# Patient Record
Sex: Male | Born: 1965 | Race: White | Hispanic: No | Marital: Married | Smoking: Current every day smoker
Health system: Southern US, Community
[De-identification: ages and names within clinical notes are randomized; demographics above are authoritative.]

## PROBLEM LIST (undated history)

## (undated) DIAGNOSIS — Z789 Other specified health status: Secondary | ICD-10-CM

## (undated) DIAGNOSIS — K4090 Unilateral inguinal hernia, without obstruction or gangrene, not specified as recurrent: Secondary | ICD-10-CM

## (undated) DIAGNOSIS — I2699 Other pulmonary embolism without acute cor pulmonale: Secondary | ICD-10-CM

## (undated) DIAGNOSIS — I82409 Acute embolism and thrombosis of unspecified deep veins of unspecified lower extremity: Secondary | ICD-10-CM

## (undated) HISTORY — PX: WISDOM TOOTH EXTRACTION: SHX21

---
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
## 2009-07-26 DIAGNOSIS — I2699 Other pulmonary embolism without acute cor pulmonale: Secondary | ICD-10-CM

## 2009-07-26 DIAGNOSIS — I82409 Acute embolism and thrombosis of unspecified deep veins of unspecified lower extremity: Secondary | ICD-10-CM

## 2009-07-26 HISTORY — PX: VENA CAVA FILTER PLACEMENT: SHX1085

## 2009-07-26 HISTORY — DX: Acute embolism and thrombosis of unspecified deep veins of unspecified lower extremity: I82.409

## 2009-07-26 HISTORY — PX: LEG SURGERY: SHX1003

## 2009-07-26 HISTORY — DX: Other pulmonary embolism without acute cor pulmonale: I26.99

## 2011-04-23 ENCOUNTER — Emergency Department (HOSPITAL_BASED_OUTPATIENT_CLINIC_OR_DEPARTMENT_OTHER)
Admission: EM | Admit: 2011-04-23 | Discharge: 2011-04-23 | Disposition: A | Discharge status: Home / Self Care | Payer: PRIVATE HEALTH INSURANCE | Attending: Emergency Medicine | Admitting: Emergency Medicine

## 2011-04-23 ENCOUNTER — Encounter: Payer: Self-pay | Admitting: Family Medicine

## 2011-04-23 DIAGNOSIS — M79609 Pain in unspecified limb: Secondary | ICD-10-CM | POA: Insufficient documentation

## 2011-04-23 DIAGNOSIS — F17200 Nicotine dependence, unspecified, uncomplicated: Secondary | ICD-10-CM | POA: Insufficient documentation

## 2011-04-23 DIAGNOSIS — L0390 Cellulitis, unspecified: Secondary | ICD-10-CM

## 2011-04-23 DIAGNOSIS — Z79899 Other long term (current) drug therapy: Secondary | ICD-10-CM | POA: Insufficient documentation

## 2011-04-23 DIAGNOSIS — Z86718 Personal history of other venous thrombosis and embolism: Secondary | ICD-10-CM | POA: Insufficient documentation

## 2011-04-23 HISTORY — DX: Acute embolism and thrombosis of unspecified deep veins of unspecified lower extremity: I82.409

## 2011-04-23 HISTORY — DX: Other pulmonary embolism without acute cor pulmonale: I26.99

## 2011-04-23 MED ORDER — SULFAMETHOXAZOLE-TRIMETHOPRIM 400-80 MG PO TABS
1.0000 | ORAL_TABLET | Freq: Once | ORAL | Status: DC
  Filled 2011-04-23: qty 1

## 2011-04-23 MED ORDER — SULFAMETHOXAZOLE-TMP DS 800-160 MG PO TABS
ORAL_TABLET | ORAL | Status: AC
  Administered 2011-04-23: 1 via ORAL
  Filled 2011-04-23: qty 1

## 2011-04-23 MED ORDER — SULFAMETHOXAZOLE-TMP DS 800-160 MG PO TABS
1.0000 | ORAL_TABLET | Freq: Once | ORAL | Status: AC
  Administered 2011-04-23: 1 via ORAL

## 2011-04-23 MED ORDER — CEPHALEXIN 500 MG PO CAPS: 500.0000 mg | ORAL_CAPSULE | Freq: Four times a day (QID) | ORAL | Status: AC

## 2011-04-23 MED ORDER — CEPHALEXIN 250 MG PO CAPS
500.0000 mg | ORAL_CAPSULE | Freq: Once | ORAL | Status: AC
  Administered 2011-04-23: 500 mg via ORAL
  Filled 2011-04-23: qty 2

## 2011-04-23 MED ORDER — SULFAMETHOXAZOLE-TRIMETHOPRIM 800-160 MG PO TABS: 1.0000 | ORAL_TABLET | Freq: Two times a day (BID) | ORAL | Status: AC

## 2011-04-23 NOTE — ED Provider Notes (Addendum)
History     CSN: [id number] Arrival date & time: 04/23/2011  3:59 PM  Chief Complaint  Patient presents with  . Arm Pain    (Consider location/radiation/quality/duration/timing/severity/associated sxs/prior treatment) HPI Comments: Pt has had a couple of small scratches to left forearm over last week or so.  Over last 2-3 days has had a red, tender place develop to left forearm, redness seems to be spreading, tenderness is worsening.  No animal bites to area  Patient is a 45 y.o. male presenting with arm pain. The history is provided by the patient.  Arm Pain This is a new problem. The current episode started more than 2 days ago. The problem occurs constantly. The problem has been gradually worsening. Pertinent negatives include no chest pain, no headaches and no shortness of breath. Associated symptoms comments: No fevers, boils, drainage. The symptoms are aggravated by nothing. The symptoms are relieved by nothing. He has tried nothing for the symptoms.    Past Medical History  Diagnosis Date  . Pulmonary embolism   . DVT (deep venous thrombosis)     Past Surgical History  Procedure Date  . Leg surgery   . Vena cava filter placement     No family history on file.  History  Substance Use Topics  . Smoking status: Current Everyday Smoker  . Smokeless tobacco: Not on file  . Alcohol Use: No      Review of Systems  Constitutional: Negative for fever, chills and fatigue.  Respiratory: Negative for chest tightness and shortness of breath.   Cardiovascular: Negative for chest pain.  Musculoskeletal: Negative for joint swelling.  Neurological: Negative for weakness and headaches.  Hematological: Negative for adenopathy.    Allergies  Review of patient's allergies indicates no known allergies.  Home Medications   Current Outpatient Rx  Name Route Sig Dispense Refill  . OXYCODONE-ACETAMINOPHEN 5-325 MG PO TABS Oral Take 1 tablet by mouth 2 (two) times daily.      .  CEPHALEXIN 500 MG PO CAPS Oral Take 1 capsule (500 mg total) by mouth 4 (four) times daily. 20 capsule 0  . SULFAMETHOXAZOLE-TRIMETHOPRIM 800-160 MG PO TABS Oral Take 1 tablet by mouth every 12 (twelve) hours. 20 tablet 0    BP 126/75  Pulse 84  Temp(Src) 97.9 F (36.6 C) (Oral)  Resp 16  Ht 6\' 2"  (1.88 m)  Wt 165 lb (74.844 kg)  BMI 21.18 kg/m2  SpO2 99%  Physical Exam  Constitutional: He is oriented to person, place, and time. He appears well-developed and well-nourished.  HENT:  Head: Normocephalic and atraumatic.  Neck: Neck supple.  Musculoskeletal: He exhibits tenderness.       Redness, warmth and tenderness to dorsal aspect of left mid forearm, extending about 4-5 cm.  No wounds noted other than some small scabbed scratches, no indurated or fluctuant areas.  No extension past elbow or wrist.  No pain with ROM of wrist suggesting tenosynovitis.  Neurological: He is alert and oriented to person, place, and time.  Skin: No rash noted.  Psychiatric: He has a normal mood and affect.    ED Course  Procedures (including critical care time)  Labs Reviewed - No data to display No results found.   1. Cellulitis       MDM  Pt appears to have localized cellulitis, no evidence of abscess or extension into deeper tissues.  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  Advised to return in 2 days for  a wound check or sooner for any worsening symptoms.  TDAP UTD        Rolan Bucco, MD 04/23/11 1624  Rolan Bucco, MD 04/23/11 [phone]

## 2011-04-23 NOTE — ED Notes (Signed)
Pt c/o left forearm "knotting up" x 2 days. Pt sts he was taken off coumadin 2 wks ago and had filter placed. Pt sts "my arm feels like my leg did when I had clots in it".

## 2012-06-01 ENCOUNTER — Encounter (HOSPITAL_BASED_OUTPATIENT_CLINIC_OR_DEPARTMENT_OTHER): Payer: Self-pay | Admitting: Emergency Medicine

## 2012-06-01 ENCOUNTER — Emergency Department (HOSPITAL_BASED_OUTPATIENT_CLINIC_OR_DEPARTMENT_OTHER)
Admission: EM | Admit: 2012-06-01 | Discharge: 2012-06-01 | Disposition: A | Discharge status: Home / Self Care | Payer: BC Managed Care – PPO | Attending: Emergency Medicine | Admitting: Emergency Medicine

## 2012-06-01 ENCOUNTER — Emergency Department (HOSPITAL_BASED_OUTPATIENT_CLINIC_OR_DEPARTMENT_OTHER): Payer: BC Managed Care – PPO

## 2012-06-01 DIAGNOSIS — Z86718 Personal history of other venous thrombosis and embolism: Secondary | ICD-10-CM | POA: Insufficient documentation

## 2012-06-01 DIAGNOSIS — F17200 Nicotine dependence, unspecified, uncomplicated: Secondary | ICD-10-CM | POA: Insufficient documentation

## 2012-06-01 DIAGNOSIS — K458 Other specified abdominal hernia without obstruction or gangrene: Secondary | ICD-10-CM | POA: Insufficient documentation

## 2012-06-01 DIAGNOSIS — R109 Unspecified abdominal pain: Secondary | ICD-10-CM | POA: Insufficient documentation

## 2012-06-01 DIAGNOSIS — K4090 Unilateral inguinal hernia, without obstruction or gangrene, not specified as recurrent: Secondary | ICD-10-CM

## 2012-06-01 NOTE — ED Provider Notes (Signed)
History     CSN: [id number]  Arrival date & time 06/01/12  1508   None     Chief Complaint  Patient presents with  . Groin Pain    (Consider location/radiation/quality/duration/timing/severity/associated sxs/prior treatment) Patient is a 46 y.o. male presenting with abdominal pain. The history is provided by the patient. No language interpreter was used.  Abdominal Pain The primary symptoms of the illness include abdominal pain. Episode onset: over a month. The onset of the illness was gradual. The problem has been gradually worsening.  Associated with: worse with standing. The patient has not had a change in bowel habit. Risk factors: none. Symptoms associated with the illness do not include anorexia or constipation. Significant associated medical issues do not include PUD, GERD or inflammatory bowel disease.  Pt has a large hernia left side of lower abdomen.   Pt reports hernia sticks out when he stands up.   Area goes down when he lies down.   Pt reports he has a ECLIPSE filter in vena cava.  Pt is worried that filter has slipped down and is causing the pain  Past Medical History  Diagnosis Date  . Pulmonary embolism   . DVT (deep venous thrombosis)     Past Surgical History  Procedure Date  . Leg surgery   . Vena cava filter placement     No family history on file.  History  Substance Use Topics  . Smoking status: Current Every Day Smoker  . Smokeless tobacco: Not on file  . Alcohol Use: No      Review of Systems  Gastrointestinal: Positive for abdominal pain. Negative for constipation and anorexia.  Genitourinary: Negative for scrotal swelling and testicular pain.  All other systems reviewed and are negative.    Allergies  Review of patient's allergies indicates no known allergies.  Home Medications  No current outpatient prescriptions on file.  BP 135/82  Pulse 83  Temp 98.2 F (36.8 C) (Oral)  Resp 18  Ht 6\' 2"  (1.88 m)  Wt 162 lb (73.483 kg)   BMI 20.80 kg/m2  SpO2 100%  Physical Exam  Nursing note and vitals reviewed. Constitutional: He is oriented to person, place, and time. He appears well-developed and well-nourished.  HENT:  Head: Normocephalic.  Right Ear: External ear normal.  Left Ear: External ear normal.  Nose: Nose normal.  Mouth/Throat: Oropharynx is clear and moist.  Eyes: Conjunctivae normal and EOM are normal. Pupils are equal, round, and reactive to light.  Neck: Normal range of motion. Neck supple.  Cardiovascular: Normal rate, regular rhythm and normal heart sounds.   Pulmonary/Chest: Effort normal and breath sounds normal.  Abdominal: Soft. Bowel sounds are normal.  Musculoskeletal: Normal range of motion.  Neurological: He is alert and oriented to person, place, and time. He has normal reflexes.  Skin: Skin is warm.  Psychiatric: He has a normal mood and affect.    ED Course  Procedures (including critical care time)  Labs Reviewed - No data to display No results found.   1. Left groin hernia       MDM  Xray shows filter is in vena cava in good position.   I advised pt I suspect hernia is the cause of his discomfort.   I advised pt to see general surgery for evaluation.  I counseled on symptoms or hernia incarceration        Elson Areas, [place] 06/01/12 1735  Lonia Skinner [place], [place] 06/01/12 1737

## 2012-06-01 NOTE — ED Notes (Signed)
Sudden sharp rt groin pain.  Recent "ECLIPSE" filter placed

## 2012-06-02 NOTE — ED Provider Notes (Signed)
Medical screening examination/treatment/procedure(s) were performed by non-physician practitioner and as supervising physician I was immediately available for consultation/collaboration.   Carleene Cooper III, MD 06/02/12 [phone]

## 2012-06-28 ENCOUNTER — Encounter (INDEPENDENT_AMBULATORY_CARE_PROVIDER_SITE_OTHER): Payer: Self-pay | Admitting: General Surgery

## 2012-06-28 ENCOUNTER — Ambulatory Visit (INDEPENDENT_AMBULATORY_CARE_PROVIDER_SITE_OTHER): Payer: BC Managed Care – PPO | Admitting: General Surgery

## 2012-06-28 VITALS — BP 122/84 | HR 83 | Temp 98.3°F | Ht 74.0 in | Wt 172.0 lb

## 2012-06-28 DIAGNOSIS — K4090 Unilateral inguinal hernia, without obstruction or gangrene, not specified as recurrent: Secondary | ICD-10-CM

## 2012-06-28 DIAGNOSIS — K439 Ventral hernia without obstruction or gangrene: Secondary | ICD-10-CM

## 2012-06-28 NOTE — Progress Notes (Signed)
Patient ID: Tyler Waters, male   DOB: [birth year], 46 y.o.   MRN: [id number]  Chief Complaint  Patient presents with  . Pre-op Exam    eval LIH and umb hernias    HPI OVA Tyler Waters is a 46 y.o. male.  This patient is referred by Dr. Ignacia Palma for evaluation of a symptomatic left inguinal hernia. He first noticed this about 6 years ago after lifting an engine for a vehicle. He noticed a bulge at his umbilicus as well as his left groin. He says that this has not really bothered him but has become larger over the last 6 years with a more rapid increase over the last year. He says it reduces when lying flat and does have some pain when the hernia bulges but otherwise denies any obstructive symptoms. He says that his bowels are functioning normally. He does have a history of DVT and pulmonary embolus in 2011 after broken leg but he says that he is not on any anticoagulation for this. HPI  Past Medical History  Diagnosis Date  . Pulmonary embolism   . DVT (deep venous thrombosis)     Past Surgical History  Procedure Date  . Leg surgery   . Vena cava filter placement     History reviewed. No pertinent family history.  Social History History  Substance Use Topics  . Smoking status: Current Every Day Smoker -- 0.5 packs/day    Types: Cigarettes  . Smokeless tobacco: Not on file  . Alcohol Use: No    No Known Allergies  No current outpatient prescriptions on file.    Review of Systems Review of Systems All other review of systems negative or noncontributory except as stated in the HPI  Blood pressure 122/84, pulse 83, temperature 98.3 F (36.8 C), temperature source Temporal, height 6\' 2"  (1.88 m), weight 172 lb (78.019 kg), SpO2 98.00%.  Physical Exam Physical Exam Physical Exam  Vitals reviewed. Constitutional: He is oriented to person, place, and time. He appears well-developed and well-nourished. No distress.  HENT:  Head: Normocephalic and atraumatic.  Mouth/Throat: No  oropharyngeal exudate.  Eyes: Conjunctivae and EOM are normal. Pupils are equal, round, and reactive to light. Right eye exhibits no discharge. Left eye exhibits no discharge. No scleral icterus.  Neck: Normal range of motion. No tracheal deviation present.  Cardiovascular: Normal rate, regular rhythm and normal heart sounds.   Pulmonary/Chest: Effort normal and breath sounds normal. No stridor. No respiratory distress. He has no wheezes. He has no rales. He exhibits no tenderness.  Abdominal: Soft. Bowel sounds are normal. He exhibits no distension and no mass. There is no tenderness. There is no rebound and no guarding. he does have a moderate sized reducible left inguinal hernia no right inguinal hernia. He also has a small supraumbilical bulge just above his umbilicus which does not appear reducible. This is nontender Musculoskeletal: Normal range of motion. He exhibits no edema and no tenderness.  Neurological: He is alert and oriented to person, place, and time.  Skin: Skin is warm and dry. No rash noted. He is not diaphoretic. No erythema. No pallor.  Psychiatric: He has a normal mood and affect. His behavior is normal. Judgment and thought content normal.    Data Reviewed   Assessment    Symptomatic left inguinal hernia-reducible Small supraumbilical hernia I discussed with him the options for laparoscopic or open repair of his hernia. We discussed the options of observation versus surgery and he is interested in surgical  repair. I think that because this is a fairly large inguinal hernia as well as the need for anticoagulation perioperatively I think that open repair would be best for this patient. Given his DVT and PE, I would plan on preoperative Lovenox. We discussed the pros and cons of repair versus observation and he would like to proceed with repair. We discussed the risks of infection, bleeding, pain, scarring, nerve injury, chronic pain, injury to the vas deferens or testicle,  bowel injury and he expressed understanding and would like to proceed with repair of both defects.    Plan    We will plan for open left inguinal hernia repair with mesh as well as supraumbilical hernia repair with possible mesh. Lovenox preoperatively       Misaki Sozio DAVID 06/28/2012, 9:45 AM

## 2012-09-12 ENCOUNTER — Telehealth (INDEPENDENT_AMBULATORY_CARE_PROVIDER_SITE_OTHER): Payer: Self-pay | Admitting: *Deleted

## 2012-09-12 NOTE — Telephone Encounter (Signed)
Patient called to state that he is having increased pain at the umbilical hernia.  Patient states he was suppose to schedule surgery in December 2013 but never did.  Patient is requesting to see Biagio Quint MD at this time so that surgery can be scheduled.

## 2012-09-15 ENCOUNTER — Ambulatory Visit (INDEPENDENT_AMBULATORY_CARE_PROVIDER_SITE_OTHER): Payer: BC Managed Care – PPO | Admitting: General Surgery

## 2012-09-15 ENCOUNTER — Telehealth (INDEPENDENT_AMBULATORY_CARE_PROVIDER_SITE_OTHER): Payer: Self-pay | Admitting: General Surgery

## 2012-09-15 VITALS — BP 108/68 | HR 78 | Temp 97.4°F | Resp 18 | Ht 74.0 in | Wt 172.0 lb

## 2012-09-15 NOTE — Progress Notes (Signed)
Patient ID: Tyler Waters, male   DOB: 07/20/1966, 46 y.o.   MRN: [id number]  Chief Complaint  Patient presents with  . New Evaluation    evakl umbilical hernia    HPI Tyler Waters is a 46 y.o. male.  This patient is known to me for prior evaluation of a symptomatic left  Inguinal hernia in the supraumbilical hernia. I saw him back in December for the same complaints. He has had a long-standing history of a symptomatic left inguinal hernia and he says that this has been increasing in size although the discomfort in the left groin has actually improved. He says that the umbilical area has developed more discomfort over the last little while and this is actually bothering him more than the groin. He says that the bulges are still reducible but he would like to have these repaired now. 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 He does have an IVC filter in place. HPI  Past Medical History  Diagnosis Date  . Pulmonary embolism   . DVT (deep venous thrombosis)     Past Surgical History  Procedure Laterality Date  . Leg surgery    . Vena cava filter placement      No family history on file.  Social History History  Substance Use Topics  . Smoking status: Current Every Day Smoker -- 0.50 packs/day    Types: Cigarettes  . Smokeless tobacco: Not on file  . Alcohol Use: No    No Known Allergies  No current outpatient prescriptions on file.   No current facility-administered medications for this visit.    Review of Systems Review of Systems All other review of systems negative or noncontributory except as stated in the HPI  Blood pressure 108/68, pulse 78, temperature 97.4 F (36.3 C), resp. rate 18, height 6' 2" (1.88 m), weight 172 lb (78.019 kg).  Physical Exam Physical Exam Physical Exam  Vitals reviewed. Constitutional: He is oriented to person, place, and time. He appears well-developed and well-nourished. No distress.  HENT:   Head: Normocephalic and atraumatic.  Mouth/Throat: No oropharyngeal exudate.  Eyes: Conjunctivae and EOM are normal. Pupils are equal, round, and reactive to light. Right eye exhibits no discharge. Left eye exhibits no discharge. No scleral icterus.  Neck: Normal range of motion. No tracheal deviation present.  Cardiovascular: Normal rate, regular rhythm and normal heart sounds.   Pulmonary/Chest: Effort normal and breath sounds normal. No stridor. No respiratory distress. He has no wheezes. He has no rales. He exhibits no tenderness.  Abdominal: Soft. Bowel sounds are normal. He exhibits no distension and no mass. There is no tenderness. There is no rebound and no guarding. he does have a reducible fairly decent sized left inguinal hernia. No right inguinal hernia. He does have a small reducible supraumbilical bulge which is tender to palpation today. Musculoskeletal: Normal range of motion. He exhibits no edema and no tenderness.  Neurological: He is alert and oriented to person, place, and time.  Skin: Skin is warm and dry. No rash noted. He is not diaphoretic. No erythema. No pallor.  Psychiatric: He has a normal mood and affect. His behavior is normal. Judgment and thought content normal.    Data Reviewed   Assessment    Symptomatic an enlarging left inguinal hernia Symptomatic supraumbilical hernia He does have reducible supraumbilical and left inguinal hernia on exam and these have been increasing in size and becoming more symptomatic.   He would like to go ahead and have these repaired. Because of the need for anticoagulation perioperatively given his history of DVT and PE I. Would not really recommend laparoscopic repair for these. I will plan on placing him on Lovenox perioperatively and we'll plan for open left inguinal hernia repair and open suprabuccal hernia repair with possible mesh. We discussed the pros and cons and risks and benefits of the procedure including infection,  bleeding, pain, scarring, recurrence, hematoma, DVT and PE, nerve injury and chronic pain or numbness, injury to the vas deferens or testicle he expressed understanding and would like to proceed with the procedure     Plan    We will plan for open left inguinal hernia  Mesh and suprabuccal hernia  With possible mesh as soon as possible. We will plan for anticoagulation perioperatively        Shellsea Borunda DAVID 09/15/2012, 10:11 AM    

## 2012-09-15 NOTE — Telephone Encounter (Signed)
Pt called to report his surgery has been scheduled for October 02, 2012.  He stated that Dr. Biagio Quint had wanted him on "blood thinner" before his surgery, but did not give him a prescription.  Dr. Delice Lesch note mentions Lovenox perioperatively.  Pt prefers it to be called in to [facility] and notify him when to start it.  Please advise.

## 2012-09-22 NOTE — Telephone Encounter (Signed)
Spoke with patient after discussing with Dr. Biagio Quint.  Dr. Biagio Quint explained that the blood thinner will be given before his anesthesia and continued for two weeks after surgery.  I explained to the patient this will be handled at the time of surgery and his discharge.  He was okay with this.

## 2012-09-27 ENCOUNTER — Encounter (HOSPITAL_BASED_OUTPATIENT_CLINIC_OR_DEPARTMENT_OTHER)
Admission: RE | Admit: 2012-09-27 | Discharge: 2012-09-27 | Disposition: A | Discharge status: Home / Self Care | Payer: BC Managed Care – PPO | Source: Ambulatory Visit | Attending: General Surgery | Admitting: General Surgery

## 2012-09-27 ENCOUNTER — Encounter (HOSPITAL_BASED_OUTPATIENT_CLINIC_OR_DEPARTMENT_OTHER): Payer: Self-pay | Admitting: *Deleted

## 2012-09-27 LAB — BASIC METABOLIC PANEL
Calcium: 9.3 mg/dL (ref 8.4–10.5)
GFR calc Af Amer: >90 mL/min (ref >90)
GFR calc non Af Amer: >90 mL/min (ref >90)
Glucose, Bld: 94 mg/dL (ref 70–99)
Potassium: 4 mEq/L (ref 3.5–5.1)
Sodium: 140 mEq/L (ref 135–145)

## 2012-09-27 LAB — CBC WITH DIFFERENTIAL/PLATELET
Basophils Relative: 0 % (ref 0–1)
Eosinophils Absolute: 0.4 10*3/uL (ref 0.0–0.7)
Lymphs Abs: 2.1 10*3/uL (ref 0.7–4.0)
MCH: 32 pg (ref 26.0–34.0)
Neutro Abs: 6.7 10*3/uL (ref 1.7–7.7)
Neutrophils Relative %: 69 % (ref 43–77)
Platelets: 237 10*3/uL (ref 150–400)
RBC: 4.56 MIL/uL (ref 4.22–5.81)

## 2012-09-27 LAB — PROTIME-INR
INR: 1.06 (ref 0.00–1.49)
Prothrombin Time: 13.7 seconds (ref 11.6–15.2)

## 2012-09-27 NOTE — Progress Notes (Signed)
Denies any heart or resp problems-no PCP- fx leg 2011-post PE and DVT- VC filter put in at [facility]

## 2012-09-27 NOTE — Progress Notes (Signed)
Discussed with Dr Joesph Fillers hx PE-DVT And Vena cava filter-pt has no pcp-no meds-would like EKg with preop labs-he would like EKG also.

## 2012-10-02 ENCOUNTER — Encounter (HOSPITAL_BASED_OUTPATIENT_CLINIC_OR_DEPARTMENT_OTHER): Payer: Self-pay | Admitting: *Deleted

## 2012-10-02 ENCOUNTER — Ambulatory Visit (HOSPITAL_BASED_OUTPATIENT_CLINIC_OR_DEPARTMENT_OTHER): Payer: BC Managed Care – PPO | Admitting: Anesthesiology

## 2012-10-02 ENCOUNTER — Encounter (HOSPITAL_BASED_OUTPATIENT_CLINIC_OR_DEPARTMENT_OTHER): Payer: Self-pay | Admitting: Anesthesiology

## 2012-10-02 ENCOUNTER — Ambulatory Visit (HOSPITAL_BASED_OUTPATIENT_CLINIC_OR_DEPARTMENT_OTHER)
Admission: RE | Admit: 2012-10-02 | Discharge: 2012-10-02 | Disposition: A | Discharge status: Home / Self Care | Payer: BC Managed Care – PPO | Source: Ambulatory Visit | Attending: General Surgery | Admitting: General Surgery

## 2012-10-02 ENCOUNTER — Encounter (HOSPITAL_BASED_OUTPATIENT_CLINIC_OR_DEPARTMENT_OTHER)
Admission: RE | Disposition: A | Discharge status: Home / Self Care | Payer: Self-pay | Source: Ambulatory Visit | Attending: General Surgery

## 2012-10-02 ENCOUNTER — Other Ambulatory Visit (INDEPENDENT_AMBULATORY_CARE_PROVIDER_SITE_OTHER): Payer: Self-pay | Admitting: General Surgery

## 2012-10-02 DIAGNOSIS — K436 Other and unspecified ventral hernia with obstruction, without gangrene: Secondary | ICD-10-CM

## 2012-10-02 DIAGNOSIS — F17200 Nicotine dependence, unspecified, uncomplicated: Secondary | ICD-10-CM | POA: Insufficient documentation

## 2012-10-02 DIAGNOSIS — Z86718 Personal history of other venous thrombosis and embolism: Secondary | ICD-10-CM | POA: Insufficient documentation

## 2012-10-02 DIAGNOSIS — J4489 Other specified chronic obstructive pulmonary disease: Secondary | ICD-10-CM | POA: Insufficient documentation

## 2012-10-02 DIAGNOSIS — K4090 Unilateral inguinal hernia, without obstruction or gangrene, not specified as recurrent: Secondary | ICD-10-CM | POA: Insufficient documentation

## 2012-10-02 DIAGNOSIS — Z86711 Personal history of pulmonary embolism: Secondary | ICD-10-CM | POA: Insufficient documentation

## 2012-10-02 DIAGNOSIS — K420 Umbilical hernia with obstruction, without gangrene: Secondary | ICD-10-CM | POA: Insufficient documentation

## 2012-10-02 HISTORY — PX: SUPRA-UMBILICAL HERNIA: SHX6105

## 2012-10-02 HISTORY — DX: Other specified health status: Z78.9

## 2012-10-02 HISTORY — PX: INSERTION OF MESH: SHX5868

## 2012-10-02 HISTORY — PX: INGUINAL HERNIA REPAIR: SHX194

## 2012-10-02 LAB — POCT HEMOGLOBIN-HEMACUE: Hemoglobin: 16 g/dL (ref 13.0–17.0)

## 2012-10-02 SURGERY — REPAIR, HERNIA, INGUINAL, ADULT
Anesthesia: General | Site: Groin | Wound class: Clean

## 2012-10-02 MED ORDER — LIDOCAINE HCL 4 % MT SOLN
OROMUCOSAL | Status: DC | PRN
  Administered 2012-10-02: 4 mL via TOPICAL

## 2012-10-02 MED ORDER — OXYCODONE HCL 5 MG PO TABS: 5.0000 mg | ORAL_TABLET | Freq: Once | ORAL | Status: DC | PRN

## 2012-10-02 MED ORDER — ONDANSETRON HCL 4 MG/2ML IJ SOLN
INTRAMUSCULAR | Status: DC | PRN
  Administered 2012-10-02: 4 mg via INTRAVENOUS

## 2012-10-02 MED ORDER — PROMETHAZINE HCL 25 MG/ML IJ SOLN: 6.2500 mg | INTRAMUSCULAR | Status: DC | PRN

## 2012-10-02 MED ORDER — LIDOCAINE-EPINEPHRINE (PF) 1 %-1:200000 IJ SOLN
INTRAMUSCULAR | Status: DC | PRN
  Administered 2012-10-02: 13:00:00

## 2012-10-02 MED ORDER — MIDAZOLAM HCL 2 MG/2ML IJ SOLN: 1.0000 mg | INTRAMUSCULAR | Status: DC | PRN

## 2012-10-02 MED ORDER — DEXAMETHASONE SODIUM PHOSPHATE 4 MG/ML IJ SOLN
INTRAMUSCULAR | Status: DC | PRN
  Administered 2012-10-02: 10 mg via INTRAVENOUS

## 2012-10-02 MED ORDER — ENOXAPARIN SODIUM 40 MG/0.4ML ~~LOC~~ SOLN: 40.0000 mg | Freq: Every day | SUBCUTANEOUS | Status: AC

## 2012-10-02 MED ORDER — FENTANYL CITRATE 0.05 MG/ML IJ SOLN
INTRAMUSCULAR | Status: DC | PRN
  Administered 2012-10-02: 50 ug via INTRAVENOUS
  Administered 2012-10-02: 100 ug via INTRAVENOUS
  Administered 2012-10-02: 50 ug via INTRAVENOUS

## 2012-10-02 MED ORDER — ACETAMINOPHEN 10 MG/ML IV SOLN
INTRAVENOUS | Status: DC | PRN
  Administered 2012-10-02: 1000 mg via INTRAVENOUS

## 2012-10-02 MED ORDER — ENOXAPARIN SODIUM 40 MG/0.4ML ~~LOC~~ SOLN
40.0000 mg | Freq: Once | SUBCUTANEOUS | Status: AC
Start: 2012-10-02 — End: 2012-10-02
  Administered 2012-10-02: 40 mg via SUBCUTANEOUS

## 2012-10-02 MED ORDER — LACTATED RINGERS IV SOLN
INTRAVENOUS | Status: DC
  Administered 2012-10-02 (×3): via INTRAVENOUS

## 2012-10-02 MED ORDER — NEOSTIGMINE METHYLSULFATE 1 MG/ML IJ SOLN
INTRAMUSCULAR | Status: DC | PRN
  Administered 2012-10-02: 3 mg via INTRAVENOUS

## 2012-10-02 MED ORDER — LIDOCAINE HCL (CARDIAC) 20 MG/ML IV SOLN
INTRAVENOUS | Status: DC | PRN
  Administered 2012-10-02: 100 mg via INTRAVENOUS

## 2012-10-02 MED ORDER — CEFAZOLIN SODIUM-DEXTROSE 2-3 GM-% IV SOLR
2.0000 g | INTRAVENOUS | Status: AC
Start: 2012-10-02 — End: 2012-10-02
  Administered 2012-10-02: 2 g via INTRAVENOUS

## 2012-10-02 MED ORDER — ROCURONIUM BROMIDE 100 MG/10ML IV SOLN
INTRAVENOUS | Status: DC | PRN
  Administered 2012-10-02: 50 mg via INTRAVENOUS

## 2012-10-02 MED ORDER — FENTANYL CITRATE 0.05 MG/ML IJ SOLN: 50.0000 ug | INTRAMUSCULAR | Status: DC | PRN

## 2012-10-02 MED ORDER — OXYCODONE HCL 5 MG PO TABS: 5.0000 mg | ORAL_TABLET | ORAL | Status: AC | PRN

## 2012-10-02 MED ORDER — PROPOFOL 10 MG/ML IV BOLUS
INTRAVENOUS | Status: DC | PRN
  Administered 2012-10-02: 200 mg via INTRAVENOUS

## 2012-10-02 MED ORDER — GLYCOPYRROLATE 0.2 MG/ML IJ SOLN
INTRAMUSCULAR | Status: DC | PRN
  Administered 2012-10-02: .4 mg via INTRAVENOUS

## 2012-10-02 MED ORDER — OXYCODONE HCL 5 MG/5ML PO SOLN: 5.0000 mg | Freq: Once | ORAL | Status: DC | PRN

## 2012-10-02 MED ORDER — MIDAZOLAM HCL 5 MG/5ML IJ SOLN
INTRAMUSCULAR | Status: DC | PRN
  Administered 2012-10-02: 2 mg via INTRAVENOUS

## 2012-10-02 MED ORDER — SODIUM CHLORIDE 0.9 % IR SOLN
Status: DC | PRN
  Administered 2012-10-02: 1

## 2012-10-02 MED ORDER — HYDROMORPHONE HCL PF 1 MG/ML IJ SOLN
0.2500 mg | INTRAMUSCULAR | Status: DC | PRN
  Administered 2012-10-02: 0.5 mg via INTRAVENOUS

## 2012-10-02 SURGICAL SUPPLY — 68 items
APPLICATOR COTTON TIP 6IN STRL (MISCELLANEOUS) ×3 IMPLANT
BENZOIN TINCTURE PRP APPL 2/3 (GAUZE/BANDAGES/DRESSINGS) IMPLANT
BLADE HEX COATED 2.75 (ELECTRODE) IMPLANT
BLADE SURG 15 STRL LF DISP TIS (BLADE) ×4 IMPLANT
BLADE SURG 15 STRL SS (BLADE) ×2
BLADE SURG ROTATE 9660 (MISCELLANEOUS) ×3 IMPLANT
CANISTER SUCTION 1200CC (MISCELLANEOUS) ×3 IMPLANT
CANISTER SUCTION 1500CC (MISCELLANEOUS) ×3 IMPLANT
CHLORAPREP W/TINT 26ML (MISCELLANEOUS) ×3 IMPLANT
CLOTH BEACON ORANGE TIMEOUT ST (SAFETY) ×3 IMPLANT
COTTONBALL LRG STERILE PKG (GAUZE/BANDAGES/DRESSINGS) ×3 IMPLANT
COVER MAYO STAND STRL (DRAPES) ×3 IMPLANT
COVER TABLE BACK 60X90 (DRAPES) ×3 IMPLANT
DECANTER SPIKE VIAL GLASS SM (MISCELLANEOUS) ×3 IMPLANT
DERMABOND ADVANCED (GAUZE/BANDAGES/DRESSINGS) ×1
DERMABOND ADVANCED .7 DNX12 (GAUZE/BANDAGES/DRESSINGS) ×2 IMPLANT
DRAIN PENROSE 1/2X12 LTX STRL (WOUND CARE) ×3 IMPLANT
DRAPE LAPAROSCOPIC ABDOMINAL (DRAPES) ×3 IMPLANT
DRAPE PED LAPAROTOMY (DRAPES) IMPLANT
DRAPE UTILITY XL STRL (DRAPES) ×3 IMPLANT
DRSG TEGADERM 4X4.75 (GAUZE/BANDAGES/DRESSINGS) ×3 IMPLANT
ELECT COATED BLADE 2.86 ST (ELECTRODE) ×3 IMPLANT
ELECT REM PT RETURN 9FT ADLT (ELECTROSURGICAL) ×3
ELECTRODE REM PT RTRN 9FT ADLT (ELECTROSURGICAL) ×2 IMPLANT
GAUZE SPONGE 4X4 12PLY STRL LF (GAUZE/BANDAGES/DRESSINGS) IMPLANT
GLOVE BIO SURGEON STRL SZ 6.5 (GLOVE) ×3 IMPLANT
GLOVE BIO SURGEON STRL SZ7 (GLOVE) ×3 IMPLANT
GLOVE BIOGEL PI IND STRL 7.5 (GLOVE) IMPLANT
GLOVE BIOGEL PI INDICATOR 7.5 (GLOVE)
GLOVE INDICATOR 7.0 STRL GRN (GLOVE) ×3 IMPLANT
GLOVE SURG SS PI 7.5 STRL IVOR (GLOVE) ×6 IMPLANT
GOWN PREVENTION PLUS XLARGE (GOWN DISPOSABLE) ×3 IMPLANT
GOWN PREVENTION PLUS XXLARGE (GOWN DISPOSABLE) ×6 IMPLANT
MESH ULTRAPRO 3X6 7.6X15CM (Mesh General) ×3 IMPLANT
NEEDLE HYPO 22GX1.5 SAFETY (NEEDLE) IMPLANT
NEEDLE HYPO 25X1 1.5 SAFETY (NEEDLE) ×3 IMPLANT
NS IRRIG 1000ML POUR BTL (IV SOLUTION) ×3 IMPLANT
PACK BASIN DAY SURGERY FS (CUSTOM PROCEDURE TRAY) ×3 IMPLANT
PATCH VENTRAL SMALL 4.3 (Mesh Specialty) ×3 IMPLANT
PENCIL BUTTON HOLSTER BLD 10FT (ELECTRODE) ×3 IMPLANT
SLEEVE SCD COMPRESS KNEE MED (MISCELLANEOUS) ×3 IMPLANT
SLEEVE SURGEON STRL (DRAPES) ×3 IMPLANT
SPONGE INTESTINAL PEANUT (DISPOSABLE) ×3 IMPLANT
SPONGE LAP 4X18 X RAY DECT (DISPOSABLE) ×6 IMPLANT
STRIP CLOSURE SKIN 1/2X4 (GAUZE/BANDAGES/DRESSINGS) IMPLANT
STRIP SUTURE WOUND CLOSURE 1/2 (SUTURE) IMPLANT
SUT ETHIBOND 0 MO6 C/R (SUTURE) ×3 IMPLANT
SUT MNCRL AB 4-0 PS2 18 (SUTURE) ×3 IMPLANT
SUT MON AB 4-0 PC3 18 (SUTURE) ×3 IMPLANT
SUT NOVA 1 T20/GS 25DT (SUTURE) IMPLANT
SUT PROLENE 0 CT 1 30 (SUTURE) IMPLANT
SUT PROLENE 2 0 CT2 30 (SUTURE) IMPLANT
SUT PROLENE 2 0 SH DA (SUTURE) ×27 IMPLANT
SUT SILK 3 0 SH 30 (SUTURE) IMPLANT
SUT VIC AB 2-0 SH 27 (SUTURE) ×3
SUT VIC AB 2-0 SH 27XBRD (SUTURE) ×6 IMPLANT
SUT VIC AB 3-0 54X BRD REEL (SUTURE) ×2 IMPLANT
SUT VIC AB 3-0 BRD 54 (SUTURE) ×1
SUT VIC AB 3-0 SH 27 (SUTURE) ×2
SUT VIC AB 3-0 SH 27X BRD (SUTURE) ×4 IMPLANT
SUT VICRYL 0 UR6 27IN ABS (SUTURE) IMPLANT
SYR BULB 3OZ (MISCELLANEOUS) ×3 IMPLANT
SYR CONTROL 10ML LL (SYRINGE) ×3 IMPLANT
TOWEL OR 17X24 6PK STRL BLUE (TOWEL DISPOSABLE) ×6 IMPLANT
TOWEL OR NON WOVEN STRL DISP B (DISPOSABLE) ×3 IMPLANT
TUBE CONNECTING 20X1/4 (TUBING) ×3 IMPLANT
WATER STERILE IRR 1000ML POUR (IV SOLUTION) IMPLANT
YANKAUER SUCT BULB TIP NO VENT (SUCTIONS) ×3 IMPLANT

## 2012-10-02 NOTE — Interval H&P Note (Signed)
History and Physical Interval Note:  10/02/2012 10:10 AM  Tyler Waters  has presented today for surgery, with the diagnosis of left inguinal hernia umbilical hernia   The various methods of treatment have been discussed with the patient and family. After consideration of risks, benefits and other options for treatment, the patient has consented to  Procedure(s): HERNIA REPAIR INGUINAL ADULT left  (Left) SUPRA-UMBILICAL HERNIA (N/A) INSERTION OF MESH (N/A) as a surgical intervention .  The patient's history has been reviewed, patient examined, no change in status, stable for surgery.  I have reviewed the patient's chart and labs.  Questions were answered to the patient's satisfaction.  He was seen and evaluated in the preop area.  Sites marked with the patient.  Risks of the procedures again discussed in lay terms and informed consent obtained.  Risks of infection, bleeding, pain, scarring, recurrence, chronic pain, nerve injury, injury to vas deferens or testicle, and bowel injury, and DVT, PE discussed and he desires to proceed with open LIH and supraumbilical hernia repairs.  I spoke with Dr. Cyndie Chime this am and he agreed with my plan for Lovenox 40mg  sq daily for two weeks.  I explained that he may have a higher risk for bleeding but I think that this risk is better than possible recurrent PE.   Lodema Pilot DAVID

## 2012-10-02 NOTE — Brief Op Note (Signed)
10/02/2012  2:00 PM  PATIENT:  VENKAT ANKNEY  47 y.o. male  PRE-OPERATIVE DIAGNOSIS:  left inguinal hernia and umbilical hernia   POST-OPERATIVE DIAGNOSIS:  left inguinal hernia and umbilical hernia   PROCEDURE:  Procedure(s) with comments: HERNIA REPAIR INGUINAL ADULT left  (Left) - with mesh SUPRA-UMBILICAL HERNIA (N/A) INSERTION OF MESH (Left) - mesh insertion umbilical  SURGEON:  Surgeon(s) and Role:    * Lodema Pilot, DO - Primary  PHYSICIAN ASSISTANT:   ASSISTANTS: none   ANESTHESIA:   general  EBL:  Total I/O In: 1722 [P.O.:222; I.V.:1500] Out: -   BLOOD ADMINISTERED:none  DRAINS: none   LOCAL MEDICATIONS USED:  MARCAINE    and LIDOCAINE   SPECIMEN:  No Specimen  DISPOSITION OF SPECIMEN:  PATHOLOGY  COUNTS:  YES  TOURNIQUET:  * No tourniquets in log *  DICTATION: .Other Dictation: Dictation Number [id number]  PLAN OF CARE: Discharge to home after PACU  PATIENT DISPOSITION:  PACU - hemodynamically stable.   Delay start of Pharmacological VTE agent (>24hrs) due to surgical blood loss or risk of bleeding: no

## 2012-10-02 NOTE — Anesthesia Procedure Notes (Signed)
Procedure Name: Intubation Date/Time: 10/02/2012 10:42 AM Performed by: Burna Cash Pre-anesthesia Checklist: Patient identified, Emergency Drugs available, Suction available and Patient being monitored Patient Re-evaluated:Patient Re-evaluated prior to inductionOxygen Delivery Method: Circle System Utilized Preoxygenation: Pre-oxygenation with 100% oxygen Intubation Type: IV induction Ventilation: Mask ventilation without difficulty Laryngoscope Size: Mac and 3 Grade View: Grade I Tube type: Oral Tube size: 8.0 mm Number of attempts: 1 Airway Equipment and Method: stylet and oral airway Placement Confirmation: ETT inserted through vocal cords under direct vision,  positive ETCO2 and breath sounds checked- equal and bilateral Secured at: 23 cm Tube secured with: Tape Dental Injury: Teeth and Oropharynx as per pre-operative assessment

## 2012-10-02 NOTE — Transfer of Care (Signed)
Immediate Anesthesia Transfer of Care Note  Patient: Tyler Waters  Procedure(s) Performed: Procedure(s) with comments: HERNIA REPAIR INGUINAL ADULT left  (Left) - with mesh SUPRA-UMBILICAL HERNIA (N/A) INSERTION OF MESH (Left) - mesh insertion umbilical  Patient Location: PACU  Anesthesia Type:General  Level of Consciousness: awake, alert  and oriented  Airway & Oxygen Therapy: Patient Spontanous Breathing and Patient connected to face mask oxygen  Post-op Assessment: Report given to PACU RN and Post -op Vital signs reviewed and stable  Post vital signs: Reviewed and stable  Complications: No apparent anesthesia complications

## 2012-10-02 NOTE — Anesthesia Preprocedure Evaluation (Signed)
Anesthesia Evaluation  Patient identified by MRN, date of birth, ID band Patient awake    Reviewed: Allergy & Precautions, H&P , NPO status , Patient's Chart, lab work & pertinent test results  Airway Mallampati: I TM Distance: >3 FB Neck ROM: Full    Dental   Pulmonary COPDCurrent Smoker,  + rhonchi         Cardiovascular Rhythm:Regular Rate:Normal     Neuro/Psych    GI/Hepatic   Endo/Other    Renal/GU      Musculoskeletal   Abdominal   Peds  Hematology   Anesthesia Other Findings   Reproductive/Obstetrics                           Anesthesia Physical Anesthesia Plan  ASA: II  Anesthesia Plan: General   Post-op Pain Management:    Induction: Intravenous  Airway Management Planned: Oral ETT  Additional Equipment:   Intra-op Plan:   Post-operative Plan: Extubation in OR  Informed Consent: I have reviewed the patients History and Physical, chart, labs and discussed the procedure including the risks, benefits and alternatives for the proposed anesthesia with the patient or authorized representative who has indicated his/her understanding and acceptance.     Plan Discussed with: CRNA and Surgeon  Anesthesia Plan Comments:         Anesthesia Quick Evaluation

## 2012-10-02 NOTE — H&P (View-Only) (Signed)
Patient ID: Tyler Waters, male   DOB: Oct 26, 1965, 47 y.o.   MRN: [id number]  Chief Complaint  Patient presents with  . New Evaluation    evakl umbilical hernia    HPI Tyler Waters is a 47 y.o. male.  This patient is known to me for prior evaluation of a symptomatic left  Inguinal hernia in the supraumbilical hernia. I saw him back in December for the same complaints. He has had a long-standing history of a symptomatic left inguinal hernia and he says that this has been increasing in size although the discomfort in the left groin has actually improved. He says that the umbilical area has developed more discomfort over the last little while and this is actually bothering him more than the groin. He says that the bulges are still reducible but he would like to have these repaired now. 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 He does have an IVC filter in place. HPI  Past Medical History  Diagnosis Date  . Pulmonary embolism   . DVT (deep venous thrombosis)     Past Surgical History  Procedure Laterality Date  . Leg surgery    . Vena cava filter placement      No family history on file.  Social History History  Substance Use Topics  . Smoking status: Current Every Day Smoker -- 0.50 packs/day    Types: Cigarettes  . Smokeless tobacco: Not on file  . Alcohol Use: No    No Known Allergies  No current outpatient prescriptions on file.   No current facility-administered medications for this visit.    Review of Systems Review of Systems All other review of systems negative or noncontributory except as stated in the HPI  Blood pressure 108/68, pulse 78, temperature 97.4 F (36.3 C), resp. rate 18, height 6\' 2"  (1.88 m), weight 172 lb (78.019 kg).  Physical Exam Physical Exam Physical Exam  Vitals reviewed. Constitutional: He is oriented to person, place, and time. He appears well-developed and well-nourished. No distress.  HENT:   Head: Normocephalic and atraumatic.  Mouth/Throat: No oropharyngeal exudate.  Eyes: Conjunctivae and EOM are normal. Pupils are equal, round, and reactive to light. Right eye exhibits no discharge. Left eye exhibits no discharge. No scleral icterus.  Neck: Normal range of motion. No tracheal deviation present.  Cardiovascular: Normal rate, regular rhythm and normal heart sounds.   Pulmonary/Chest: Effort normal and breath sounds normal. No stridor. No respiratory distress. He has no wheezes. He has no rales. He exhibits no tenderness.  Abdominal: Soft. Bowel sounds are normal. He exhibits no distension and no mass. There is no tenderness. There is no rebound and no guarding. he does have a reducible fairly decent sized left inguinal hernia. No right inguinal hernia. He does have a small reducible supraumbilical bulge which is tender to palpation today. Musculoskeletal: Normal range of motion. He exhibits no edema and no tenderness.  Neurological: He is alert and oriented to person, place, and time.  Skin: Skin is warm and dry. No rash noted. He is not diaphoretic. No erythema. No pallor.  Psychiatric: He has a normal mood and affect. His behavior is normal. Judgment and thought content normal.    Data Reviewed   Assessment    Symptomatic an enlarging left inguinal hernia Symptomatic supraumbilical hernia He does have reducible supraumbilical and left inguinal hernia on exam and these have been increasing in size and becoming more symptomatic.  He would like to go ahead and have these repaired. Because of the need for anticoagulation perioperatively given his history of DVT and PE I. Would not really recommend laparoscopic repair for these. I will plan on placing him on Lovenox perioperatively and we'll plan for open left inguinal hernia repair and open suprabuccal hernia repair with possible mesh. We discussed the pros and cons and risks and benefits of the procedure including infection,  bleeding, pain, scarring, recurrence, hematoma, DVT and PE, nerve injury and chronic pain or numbness, injury to the vas deferens or testicle he expressed understanding and would like to proceed with the procedure     Plan    We will plan for open left inguinal hernia  Mesh and suprabuccal hernia  With possible mesh as soon as possible. We will plan for anticoagulation perioperatively        Tyler Waters DAVID 09/15/2012, 10:11 AM

## 2012-10-02 NOTE — Anesthesia Postprocedure Evaluation (Signed)
  Anesthesia Post-op Note  Patient: Tyler Waters  Procedure(s) Performed: Procedure(s) with comments: HERNIA REPAIR INGUINAL ADULT left  (Left) - with mesh SUPRA-UMBILICAL HERNIA (N/A) INSERTION OF MESH (Left) - mesh insertion umbilical  Patient Location: PACU  Anesthesia Type:General  Level of Consciousness: awake and alert   Airway and Oxygen Therapy: Patient Spontanous Breathing  Post-op Pain: mild  Post-op Assessment: Post-op Vital signs reviewed, Patient's Cardiovascular Status Stable, Respiratory Function Stable, Patent Airway, No signs of Nausea or vomiting and Pain level controlled  Post-op Vital Signs: stable  Complications: No apparent anesthesia complications

## 2012-10-03 ENCOUNTER — Encounter (HOSPITAL_BASED_OUTPATIENT_CLINIC_OR_DEPARTMENT_OTHER): Payer: Self-pay | Admitting: General Surgery

## 2012-10-03 ENCOUNTER — Telehealth (INDEPENDENT_AMBULATORY_CARE_PROVIDER_SITE_OTHER): Payer: Self-pay | Admitting: General Surgery

## 2012-10-03 NOTE — Telephone Encounter (Signed)
Pt called to report "the pain medicine isn't working."  He had LIH and umbilical hernias repaired yesterday, is taking only 1 pill Q4H but is getting no relief.  Advised pt to take 2 pills together for next 4-6 doses, and augment with ibuprofen 400-600 mg Q6H.  Also use ice to the incision to numb the area and for swelling at the site.  Suggested he elevate his feet when sitting and his scrotum with a rolled hand towel.  He will try this and call back as needed.

## 2012-10-03 NOTE — Op Note (Signed)
NAME:  RITHWIK, SCHMIEG              ACCOUNT NO.:  [id number]  MEDICAL RECORD NO.:  [id number]  LOCATION:  [facility]                        FACILITY:  [facility]  PHYSICIAN:  Lodema Pilot, MD       DATE OF BIRTH:  09/17/1965  DATE OF PROCEDURE:  10/02/2012 DATE OF DISCHARGE:  10/02/2012                              OPERATIVE REPORT   PROCEDURE:  Open repair of left inguinal hernia and open repair of a supraumbilical hernia with mesh.  PREOPERATIVE DIAGNOSIS:  Left inguinal hernia and supraumbilical hernia.  POSTOPERATIVE DIAGNOSIS:  Left inguinal hernia and supraumbilical hernia.  SURGEON:  Lodema Pilot, MD  ASSISTANT:  None.  ANESTHESIA:  General endotracheal tube anesthesia with 40 mL of 1% lidocaine with epinephrine and 0.25% Marcaine in a 50:50 mixture.  FLUIDS:  1500 mL of crystalloid.  ESTIMATED BLOOD LOSS:  Minimal.  DRAINS:  None.  SPECIMENS:  None.  COMPLICATIONS:  None apparent.  FINDINGS:  Large direct inguinal hernia, repaired with Ultrapro mesh. Two small supraumbilical defects, which were connected to make 1 defect and repaired with a preperitoneal placement of Proceed ventral patch measuring 4.3 cm x 4.3 cm.  INDICATION FOR PROCEDURE:  Mr. Oguinn is a 47 year old male with longstanding left inguinal hernia, which had been increasing in size, and symptomatic.  He also had incarcerated supraumbilical defect, which has been causing more discomfort recently and desires operative repair.  OPERATIVE DETAILS:  Mr. Elmquist was seen and evaluated in the preoperative area, and risks and benefits of the procedure were again discussed in lay terms.  Informed consent was obtained.  Surgical sites were marked prior to anesthetic administration, and he was taken to the operating room, placed on the table in the supine position. Prophylactic antibiotics were given and general endotracheal tube anesthesia was obtained.  He was also given 40 mg of subcutaneous Lovenox due to his  history of prior DVT and pulmonary embolus. Procedure time-out was performed with all operative team members to confirm proper placement, procedure.  Then I made an oblique incision over the left inguinal canal and dissection down to the external oblique fascia using Bovie electrocautery.  The fascia was sharply incised, and along the length of its fibers, opening the external ring the ilioinguinal nerve was identified and was preserved.  The cord was dissected circumferentially using blunt dissection at the pubic tubercle, and a Penrose drain was passed for gentle retraction.  He had a large direct hernia defect with the thickened sac, and this was easily separated from the cord structures and replaced back in the peritoneum and the floor of the inguinal canal was imbricated over the direct space using 2-0 Vicryl sutures.  I then inspected the cord structures looking for an indirect hernia, and I did not identify any indirect hernia associated with the cord.  I then decided to proceed with repair using a 3-inch x 6-inch Ultrapro mesh, which was cut to fit the inguinal canal and sutured in place at the pubic tubercle with a 2-0 Prolene suture. This was run along the shelving edge of the inguinal ligament lateral to the cord structures.  A slit was placed in the lateral aspect of the mesh, and  the mesh was passed around the cord creating a new internal ring.  The mesh was then fixed circumferentially around the mesh medially and cephalad and laterally using interrupted 2-0 Prolene sutures, taking care to avoid injury to neurovascular structures.  The ilioinguinal nerve was placed along with the cord structures and coming out of the internal ring.  Then, the tails of the mesh were tacked laterally creating a new internal ring.  The mesh appeared to lie flat and cover all the defects, and the wound was irrigated with sterile saline solution.  It was noted be hemostatic.  The external  oblique fascia was then approximated with a running 2-0 Vicryl suture in an open fashion, and the Scarpa's fascia was approximated with a running 3-0 Vicryl suture.  Skin edges were approximated with 4-0 Monocryl subcuticular suture.  Then, I made a longitudinal supraumbilical incision and dissected through the subcutaneous tissue using Bovie electrocautery.  He was noted to have some preperitoneal fat, which was herniated out through a very tiny defect in the midline.  The fat was amputated and ligated with a 2-0 Vicryl suture.  This was replaced back in the preperitoneal space, again left with a small fascial defect.  He appeared to have some other abnormal trans fat just to the left of that defect.  That fat was dissected along the fascia and was noted to have another slightly larger defect, again containing preperitoneal fat, which was incarcerated as well.  After verifying that there was no bowel contents in the hernia sac, I ligated this fat as well in a similar fashion, and both of these 2 defects were separated with only a few millimeters of fascia connected with defects for one defect, which was about 1-1.5 cm wide.  I did not appreciate any obvious umbilical hernia. I palpated and inspected the fascia surrounding.  There did not appear to be any other fascial defect.  I created some space in the preperitoneal space for placement of a 4.3 cm x 4.3 cm Proceed ventral patch, and 2-0 Prolene double-armed sutures were placed at the 12 o'clock, 3 o'clock, 6 o'clock, and 9 o'clock positions from the mesh and then placed under direct vision visualization through the fascia and the mesh was placed in the preperitoneal space and the sutures were pulled up.  The cecum and the mesh secured along the undersurface of the fascia and the sutures were secured.  The tails of the mesh were cut, and then the fascia was approximated over the defect using interrupted 0 Ethibond sutures, incorporating  the tail of the mesh in the midline to keep the mesh flush along the abdominal wall.  The sutures were secured, and the wound was irrigated with sterile saline solution, and this was also injected with 1% lidocaine with epinephrine and 0.25% Marcaine in 50:50 mixture.  The dermis was then approximated with interrupted 3-0 Vicryl sutures, and the skin edges were approximated with 4-0 Monocryl subcuticular suture.  Skin was washed and dried and Dermabond was applied at all skin incisions, and sterile suction dressing was applied to the umbilicus.  The patient tolerated the procedure well without apparent complications.          ______________________________ Lodema Pilot, MD     BL/MEDQ  D:  10/02/2012  T:  10/03/2012  Job:  [id number]

## 2012-10-03 NOTE — Op Note (Deleted)
NAME:  Tyler Waters, Tyler Waters              ACCOUNT NO.:  [id number]  MEDICAL RECORD NO.:  [id number]  LOCATION:  [facility]                        FACILITY:  [facility]  PHYSICIAN:  Brian Layton, MD       DATE OF BIRTH:  02/12/1966  DATE OF PROCEDURE:  10/02/2012 DATE OF DISCHARGE:  10/02/2012                              OPERATIVE REPORT   PROCEDURE:  Open repair of left inguinal hernia and open repair of a supraumbilical hernia with mesh.  PREOPERATIVE DIAGNOSIS:  Left inguinal hernia and supraumbilical hernia.  POSTOPERATIVE DIAGNOSIS:  Left inguinal hernia and supraumbilical hernia.  SURGEON:  Brian Layton, MD  ASSISTANT:  None.  ANESTHESIA:  General endotracheal tube anesthesia with 40 mL of 1% lidocaine with epinephrine and 0.25% Marcaine in a 50:50 mixture.  FLUIDS:  1500 mL of crystalloid.  ESTIMATED BLOOD LOSS:  Minimal.  DRAINS:  None.  SPECIMENS:  None.  COMPLICATIONS:  None apparent.  FINDINGS:  Large direct inguinal hernia, repaired with Ultrapro mesh. Two small supraumbilical defects, which were connected to make 1 defect and repaired with a preperitoneal placement of Proceed ventral patch measuring 4.3 cm x 4.3 cm.  INDICATION FOR PROCEDURE:  Tyler Waters is a 46-year-old male with longstanding left inguinal hernia, which had been increasing in size, and symptomatic.  He also had incarcerated supraumbilical defect, which has been causing more discomfort recently and desires operative repair.  OPERATIVE DETAILS:  Tyler Waters was seen and evaluated in the preoperative area, and risks and benefits of the procedure were again discussed in lay terms.  Informed consent was obtained.  Surgical sites were marked prior to anesthetic administration, and he was taken to the operating room, placed on the table in the supine position. Prophylactic antibiotics were given and general endotracheal tube anesthesia was obtained.  He was also given 40 mg of subcutaneous Lovenox due to his  history of prior DVT and pulmonary embolus. Procedure time-out was performed with all operative team members to confirm proper placement, procedure.  Then I made an oblique incision over the left inguinal canal and dissection down to the external oblique fascia using Bovie electrocautery.  The fascia was sharply incised, and along the length of its fibers, opening the external ring the ilioinguinal nerve was identified and was preserved.  The cord was dissected circumferentially using blunt dissection at the pubic tubercle, and a Penrose drain was passed for gentle retraction.  He had a large direct hernia defect with the thickened sac, and this was easily separated from the cord structures and replaced back in the peritoneum and the floor of the inguinal canal was imbricated over the direct space using 2-0 Vicryl sutures.  I then inspected the cord structures looking for an indirect hernia, and I did not identify any indirect hernia associated with the cord.  I then decided to proceed with repair using a 3-inch x 6-inch Ultrapro mesh, which was cut to fit the inguinal canal and sutured in place at the pubic tubercle with a 2-0 Prolene suture. This was run along the shelving edge of the inguinal ligament lateral to the cord structures.  A slit was placed in the lateral aspect of the mesh, and   the mesh was passed around the cord creating a new internal ring.  The mesh was then fixed circumferentially around the mesh medially and cephalad and laterally using interrupted 2-0 Prolene sutures, taking care to avoid injury to neurovascular structures.  The ilioinguinal nerve was placed along with the cord structures and coming out of the internal ring.  Then, the tails of the mesh were tacked laterally creating a new internal ring.  The mesh appeared to lie flat and cover all the defects, and the wound was irrigated with sterile saline solution.  It was noted be hemostatic.  The external  oblique fascia was then approximated with a running 2-0 Vicryl suture in an open fashion, and the Scarpa's fascia was approximated with a running 3-0 Vicryl suture.  Skin edges were approximated with 4-0 Monocryl subcuticular suture.  Then, I made a longitudinal supraumbilical incision and dissected through the subcutaneous tissue using Bovie electrocautery.  He was noted to have some preperitoneal fat, which was herniated out through a very tiny defect in the midline.  The fat was amputated and ligated with a 2-0 Vicryl suture.  This was replaced back in the preperitoneal space, again left with a small fascial defect.  He appeared to have some other abnormal trans fat just to the left of that defect.  That fat was dissected along the fascia and was noted to have another slightly larger defect, again containing preperitoneal fat, which was incarcerated as well.  After verifying that there was no bowel contents in the hernia sac, I ligated this fat as well in a similar fashion, and both of these 2 defects were separated with only a few millimeters of fascia connected with defects for one defect, which was about 1-1.5 cm wide.  I did not appreciate any obvious umbilical hernia. I palpated and inspected the fascia surrounding.  There did not appear to be any other fascial defect.  I created some space in the preperitoneal space for placement of a 4.3 cm x 4.3 cm Proceed ventral patch, and 2-0 Prolene double-armed sutures were placed at the 12 o'clock, 3 o'clock, 6 o'clock, and 9 o'clock positions from the mesh and then placed under direct vision visualization through the fascia and the mesh was placed in the preperitoneal space and the sutures were pulled up.  The cecum and the mesh secured along the undersurface of the fascia and the sutures were secured.  The tails of the mesh were cut, and then the fascia was approximated over the defect using interrupted 0 Ethibond sutures, incorporating  the tail of the mesh in the midline to keep the mesh flush along the abdominal wall.  The sutures were secured, and the wound was irrigated with sterile saline solution, and this was also injected with 1% lidocaine with epinephrine and 0.25% Marcaine in 50:50 mixture.  The dermis was then approximated with interrupted 3-0 Vicryl sutures, and the skin edges were approximated with 4-0 Monocryl subcuticular suture.  Skin was washed and dried and Dermabond was applied at all skin incisions, and sterile suction dressing was applied to the umbilicus.  The patient tolerated the procedure well without apparent complications.          ______________________________ Brian Layton, MD     BL/MEDQ  D:  10/02/2012  T:  10/03/2012  Job:  [id number] 

## 2012-10-06 ENCOUNTER — Telehealth (INDEPENDENT_AMBULATORY_CARE_PROVIDER_SITE_OTHER): Payer: Self-pay | Admitting: *Deleted

## 2012-10-06 NOTE — Telephone Encounter (Signed)
Patient called to state he has not had a bowel movement since Monday.  Patient states he has been taking stool softeners.  Encouraged patient to go ahead and try some Milk of Mag to produce a bowel movement.  Patient states understanding and agreeable at this time.  Patient denies any sign of infection to incision sites and they appear to be healing well per patient.

## 2012-10-09 ENCOUNTER — Encounter (INDEPENDENT_AMBULATORY_CARE_PROVIDER_SITE_OTHER): Payer: Self-pay | Admitting: *Deleted

## 2012-10-09 ENCOUNTER — Telehealth (INDEPENDENT_AMBULATORY_CARE_PROVIDER_SITE_OTHER): Payer: Self-pay | Admitting: *Deleted

## 2012-10-09 NOTE — Telephone Encounter (Signed)
Patient called in to request a note for work to return on 10/16/12 with light duty restrictions until his follow up appt with Dr. Biagio Quint.  Patient also requested refill of pain medication called into [facility] [phone]. Norco 5/325mg  1-2 tabs PO every 4-6 hours as needed for pain #30 no refills.

## 2012-10-16 ENCOUNTER — Telehealth (INDEPENDENT_AMBULATORY_CARE_PROVIDER_SITE_OTHER): Payer: Self-pay

## 2012-10-16 NOTE — Telephone Encounter (Signed)
The pt called to ask when he can lift over 20-25lbs?  I told him it can typically be 6 weeks for hernia repair but I will clarify it with [facility] and Dr Biagio Quint.

## 2012-10-20 ENCOUNTER — Ambulatory Visit (INDEPENDENT_AMBULATORY_CARE_PROVIDER_SITE_OTHER): Payer: BC Managed Care – PPO | Admitting: General Surgery

## 2012-10-20 ENCOUNTER — Telehealth (INDEPENDENT_AMBULATORY_CARE_PROVIDER_SITE_OTHER): Payer: Self-pay

## 2012-10-20 ENCOUNTER — Encounter (INDEPENDENT_AMBULATORY_CARE_PROVIDER_SITE_OTHER): Payer: Self-pay | Admitting: General Surgery

## 2012-10-20 VITALS — BP 118/82 | HR 86 | Temp 98.0°F | Resp 16 | Ht 74.0 in | Wt 169.4 lb

## 2012-10-20 DIAGNOSIS — Z4889 Encounter for other specified surgical aftercare: Secondary | ICD-10-CM

## 2012-10-20 DIAGNOSIS — Z5189 Encounter for other specified aftercare: Secondary | ICD-10-CM

## 2012-10-20 NOTE — Progress Notes (Signed)
Subjective:     Patient ID: Tyler Waters, male   DOB: June 29, 1966, 47 y.o.   MRN: [id number]  HPI He follows up 3 weeks s/p open LIH and umbilical hernia repair with mesh. No complaints.  No pain and bowels okay.  He says that he can feel the mesh in the umbilical region when he bends but it is not painful.    Review of Systems     Objective:   Physical Exam His incisions are looking nice.  No evidence of recurrent hernia at umbilicus or inguinal region.    Assessment:     Status post open left inguinal hernia repair with mesh and open umbilical hernia repair with mesh-doing well He is doing very well from this procedure and has had an appropriate postoperative recovery. He is off pain medications and is asking to return to work.  Counseled him to continue with light duty for another 2 weeks and continue to no lifting otherwise he can return to work. He looks good and no apparent locations and he can followup with me on a when necessary basis     Plan:     gradually increase activity as tolerated. By duty for another 2 weeksotherwise she can return to work in and follow up with me.

## 2012-10-20 NOTE — Telephone Encounter (Signed)
FMLA & Disability forms signed & faxed to [phone] attn: Freda Munro in HR, fax confirmation rec'd and attached to outgoing fax.  Forms sent to medical records to scan in chart.

## 2013-06-22 ENCOUNTER — Encounter (HOSPITAL_BASED_OUTPATIENT_CLINIC_OR_DEPARTMENT_OTHER): Payer: Self-pay | Admitting: Emergency Medicine

## 2013-06-22 ENCOUNTER — Emergency Department (HOSPITAL_BASED_OUTPATIENT_CLINIC_OR_DEPARTMENT_OTHER)
Admission: EM | Admit: 2013-06-22 | Discharge: 2013-06-22 | Disposition: A | Discharge status: Home / Self Care | Payer: BC Managed Care – PPO | Attending: Emergency Medicine | Admitting: Emergency Medicine

## 2013-06-22 ENCOUNTER — Emergency Department (HOSPITAL_BASED_OUTPATIENT_CLINIC_OR_DEPARTMENT_OTHER): Payer: BC Managed Care – PPO

## 2013-06-22 DIAGNOSIS — Z86711 Personal history of pulmonary embolism: Secondary | ICD-10-CM | POA: Insufficient documentation

## 2013-06-22 DIAGNOSIS — R1032 Left lower quadrant pain: Secondary | ICD-10-CM | POA: Insufficient documentation

## 2013-06-22 DIAGNOSIS — Z86718 Personal history of other venous thrombosis and embolism: Secondary | ICD-10-CM | POA: Insufficient documentation

## 2013-06-22 DIAGNOSIS — M545 Low back pain, unspecified: Secondary | ICD-10-CM | POA: Insufficient documentation

## 2013-06-22 DIAGNOSIS — M546 Pain in thoracic spine: Secondary | ICD-10-CM | POA: Insufficient documentation

## 2013-06-22 DIAGNOSIS — R109 Unspecified abdominal pain: Secondary | ICD-10-CM | POA: Insufficient documentation

## 2013-06-22 DIAGNOSIS — M549 Dorsalgia, unspecified: Secondary | ICD-10-CM

## 2013-06-22 DIAGNOSIS — Z7901 Long term (current) use of anticoagulants: Secondary | ICD-10-CM | POA: Insufficient documentation

## 2013-06-22 DIAGNOSIS — M5380 Other specified dorsopathies, site unspecified: Secondary | ICD-10-CM | POA: Insufficient documentation

## 2013-06-22 DIAGNOSIS — R1031 Right lower quadrant pain: Secondary | ICD-10-CM | POA: Insufficient documentation

## 2013-06-22 DIAGNOSIS — F17200 Nicotine dependence, unspecified, uncomplicated: Secondary | ICD-10-CM | POA: Insufficient documentation

## 2013-06-22 LAB — COMPREHENSIVE METABOLIC PANEL
Albumin: 4 g/dL (ref 3.5–5.2)
BUN: 5 mg/dL — ABNORMAL LOW (ref 6–23)
CO2: 29 mEq/L (ref 19–32)
Chloride: 103 mEq/L (ref 96–112)
Creatinine, Ser: 0.9 mg/dL (ref 0.50–1.35)
GFR calc Af Amer: >90 mL/min (ref >90)
GFR calc non Af Amer: >90 mL/min (ref >90)
Total Bilirubin: 0.5 mg/dL (ref 0.3–1.2)

## 2013-06-22 LAB — URINALYSIS, ROUTINE W REFLEX MICROSCOPIC
Bilirubin Urine: NEGATIVE
Glucose, UA: NEGATIVE mg/dL
Hgb urine dipstick: NEGATIVE
Ketones, ur: NEGATIVE mg/dL
Leukocytes, UA: NEGATIVE
pH: 7 (ref 5.0–8.0)

## 2013-06-22 LAB — CBC WITH DIFFERENTIAL/PLATELET
Basophils Relative: 0 % (ref 0–1)
HCT: 40 % (ref 39.0–52.0)
Hemoglobin: 13 g/dL (ref 13.0–17.0)
MCH: 31.3 pg (ref 26.0–34.0)
MCHC: 32.5 g/dL (ref 30.0–36.0)
Monocytes Absolute: 0.7 10*3/uL (ref 0.1–1.0)
Monocytes Relative: 7 % (ref 3–12)
Neutro Abs: 7.9 10*3/uL — ABNORMAL HIGH (ref 1.7–7.7)

## 2013-06-22 LAB — CG4 I-STAT (LACTIC ACID): Lactic Acid, Venous: 0.69 mmol/L (ref 0.5–2.2)

## 2013-06-22 MED ORDER — IOHEXOL 300 MG/ML  SOLN
100.0000 mL | Freq: Once | INTRAMUSCULAR | Status: AC | PRN
  Administered 2013-06-22: 100 mL via INTRAVENOUS

## 2013-06-22 MED ORDER — METHOCARBAMOL 500 MG PO TABS: 500.0000 mg | ORAL_TABLET | Freq: Two times a day (BID) | ORAL | Status: AC

## 2013-06-22 MED ORDER — MELOXICAM 7.5 MG PO TABS: 7.5000 mg | ORAL_TABLET | Freq: Every day | ORAL | Status: AC

## 2013-06-22 MED ORDER — OXYCODONE-ACETAMINOPHEN 5-325 MG PO TABS: 1.0000 | ORAL_TABLET | Freq: Once | ORAL | Status: DC

## 2013-06-22 MED ORDER — HYDROCODONE-IBUPROFEN 7.5-200 MG PO TABS: 1.0000 | ORAL_TABLET | Freq: Four times a day (QID) | ORAL | Status: AC | PRN

## 2013-06-22 MED ORDER — DIAZEPAM 2 MG PO TABS: 2.0000 mg | ORAL_TABLET | Freq: Once | ORAL | Status: DC

## 2013-06-22 NOTE — ED Notes (Signed)
C/o mid back pain x 1 week-pain worse with movement-slow steady gait in to triage

## 2013-06-22 NOTE — ED Provider Notes (Signed)
Medical screening examination/treatment/procedure(s) were conducted as a shared visit with non-physician practitioner(s) and myself.  I personally evaluated the patient during the encounter.  EKG Interpretation   None        Patient here with acute onset back pain. Assocaited abdominal pain. Point tenderness on back on exam in upper lumbar spine. Abdomen diffusely tender with guarding. Will give pain meds and CT. CT without acute abnormalities. Unsure etiology of abdominal pain. Back pain likely musculoskeletal. Instructed to f/u with PCP.  Dagmar Hait, MD 06/22/13 [phone]

## 2013-06-22 NOTE — ED Provider Notes (Signed)
CSN: [id number]     Arrival date & time 06/22/13  1435 History   First MD Initiated Contact with Patient 06/22/13 1442     Chief Complaint  Patient presents with  . Back Pain   (Consider location/radiation/quality/duration/timing/severity/associated sxs/prior Treatment) Patient is a 47 y.o. male presenting with back pain. The history is provided by the patient.  Back Pain Location:  Thoracic spine Quality:  Aching Radiates to: to both sides. Pain severity:  Severe (8/10) Pain is:  Same all the time Onset quality:  Sudden Duration:  1 week Timing:  Constant Progression:  Worsening Chronicity:  New Context: physical stress   Relieved by:  Nothing Worsened by:  Movement, standing, twisting, bending and ambulation Ineffective treatments: flexeril and goody powder. Associated symptoms: abdominal pain   Associated symptoms: no chest pain, no dysuria, no fever and no headaches    Tyler Waters is a 47 y.o. male who presents to the ED with low back pain that started a week ago after raking the yard. The pain has continued and gotten worse. The pain now radiates to the abdomen and is severe.  Past Medical History  Diagnosis Date  . Pulmonary embolism 2011    post fx leg  . DVT (deep venous thrombosis) 2011    post fx leg  . Medical history non-contributory    Past Surgical History  Procedure Laterality Date  . Leg surgery  2011    fx rt lower leg  . Vena cava filter placement  2011  . Fracture surgery  1984    fx lt ankle  . Wisdom tooth extraction    . Inguinal hernia repair Left 10/02/2012    Procedure: HERNIA REPAIR INGUINAL ADULT left ;  Surgeon: Lodema Pilot, DO;  Location: [facility];  Service: General;  Laterality: Left;  with mesh  . Supra-umbilical hernia N/A 10/02/2012    Procedure: SUPRA-UMBILICAL HERNIA;  Surgeon: Lodema Pilot, DO;  Location: [facility];  Service: General;  Laterality: N/A;  . Insertion of mesh Left 10/02/2012     Procedure: INSERTION OF MESH;  Surgeon: Lodema Pilot, DO;  Location: [facility];  Service: General;  Laterality: Left;  mesh insertion umbilical   No family history on file. History  Substance Use Topics  . Smoking status: Current Every Day Smoker -- 0.50 packs/day    Types: Cigarettes  . Smokeless tobacco: Not on file  . Alcohol Use: No    Review of Systems  Constitutional: Negative for fever and chills.  HENT: Negative.   Eyes: Negative for visual disturbance.  Respiratory: Negative for cough and shortness of breath.   Cardiovascular: Negative for chest pain.  Gastrointestinal: Positive for abdominal pain. Negative for nausea, vomiting, diarrhea and constipation.  Genitourinary: Negative for dysuria, urgency and frequency.  Musculoskeletal: Positive for back pain. Negative for myalgias.  Skin: Negative for wound.  Neurological: Negative for syncope, light-headedness and headaches.  Psychiatric/Behavioral: The patient is not nervous/anxious.     Allergies  Review of patient's allergies indicates no known allergies.  Home Medications   Current Outpatient Rx  Name  Route  Sig  Dispense  Refill  . Aspirin-Acetaminophen-Caffeine (GOODY HEADACHE PO)   Oral   Take by mouth.         . Cyclobenzaprine HCl (FLEXERIL PO)   Oral   Take by mouth.         Marland Kitchen acetaminophen (TYLENOL) 325 MG tablet   Oral   Take 650 mg by  mouth every 6 (six) hours as needed for pain.         Marland Kitchen enoxaparin (LOVENOX) 40 MG/0.4ML injection   Subcutaneous   Inject 0.4 mLs (40 mg total) into the skin daily.   14 Syringe   0   . oxyCODONE (OXY IR/ROXICODONE) 5 MG immediate release tablet   Oral   Take 1 tablet (5 mg total) by mouth every 4 (four) hours as needed.   45 tablet   0    BP 114/78  Pulse 81  Temp(Src) 98 F (36.7 C) (Oral)  Resp 16  Ht 6\' 2"  (1.88 m)  Wt 180 lb (81.647 kg)  BMI 23.10 kg/m2  SpO2 98% Physical Exam  Nursing note and vitals  reviewed. Constitutional: He is oriented to person, place, and time. He appears well-developed and well-nourished. No distress.  HENT:  Head: Normocephalic and atraumatic.  Eyes: Conjunctivae and EOM are normal.  Neck: Neck supple.  Cardiovascular: Normal rate, regular rhythm and normal heart sounds.   Pulmonary/Chest: Effort normal and breath sounds normal.  Abdominal: Soft. Bowel sounds are normal. There is tenderness in the right lower quadrant, suprapubic area and left lower quadrant. There is guarding. There is no rebound and no CVA tenderness.  Musculoskeletal:       Lumbar back: He exhibits decreased range of motion, tenderness and spasm. He exhibits normal pulse.  Neurological: He is alert and oriented to person, place, and time. He has normal strength and normal reflexes. No cranial nerve deficit or sensory deficit.  Skin: Skin is warm and dry.  Psychiatric: He has a normal mood and affect. His behavior is normal.   Results for orders placed during the hospital encounter of 06/22/13 (from the past 24 hour(s))  URINALYSIS, ROUTINE W REFLEX MICROSCOPIC     Status: Abnormal   Collection Time    06/22/13  3:25 PM      Result Value Range   Color, Urine YELLOW  YELLOW   APPearance CLEAR  CLEAR   Specific Gravity, Urine 1.004 (*) 1.005 - 1.030   pH 7.0  5.0 - 8.0   Glucose, UA NEGATIVE  NEGATIVE mg/dL   Hgb urine dipstick NEGATIVE  NEGATIVE   Bilirubin Urine NEGATIVE  NEGATIVE   Ketones, ur NEGATIVE  NEGATIVE mg/dL   Protein, ur NEGATIVE  NEGATIVE mg/dL   Urobilinogen, UA 0.2  0.0 - 1.0 mg/dL   Nitrite NEGATIVE  NEGATIVE   Leukocytes, UA NEGATIVE  NEGATIVE  CBC WITH DIFFERENTIAL     Status: Abnormal   Collection Time    06/22/13  3:25 PM      Result Value Range   WBC 10.7 (*) 4.0 - 10.5 K/uL   RBC 4.16 (*) 4.22 - 5.81 MIL/uL   Hemoglobin 13.0  13.0 - 17.0 g/dL   HCT 16.1  09.6 - 04.5 %   MCV 96.2  78.0 - 100.0 fL   MCH 31.3  26.0 - 34.0 pg   MCHC 32.5  30.0 - 36.0 g/dL    RDW 40.9  81.1 - 91.4 %   Platelets 244  150 - 400 K/uL   Neutrophils Relative % 74  43 - 77 %   Neutro Abs 7.9 (*) 1.7 - 7.7 K/uL   Lymphocytes Relative 17  12 - 46 %   Lymphs Abs 1.8  0.7 - 4.0 K/uL   Monocytes Relative 7  3 - 12 %   Monocytes Absolute 0.7  0.1 - 1.0 K/uL   Eosinophils Relative 3  0 - 5 %   Eosinophils Absolute 0.3  0.0 - 0.7 K/uL   Basophils Relative 0  0 - 1 %   Basophils Absolute 0.0  0.0 - 0.1 K/uL  COMPREHENSIVE METABOLIC PANEL     Status: Abnormal   Collection Time    06/22/13  3:25 PM      Result Value Range   Sodium 141  135 - 145 mEq/L   Potassium 4.2  3.5 - 5.1 mEq/L   Chloride 103  96 - 112 mEq/L   CO2 29  19 - 32 mEq/L   Glucose, Bld 89  70 - 99 mg/dL   BUN 5 (*) 6 - 23 mg/dL   Creatinine, Ser 0.45  0.50 - 1.35 mg/dL   Calcium 9.5  8.4 - 40.9 mg/dL   Total Protein 7.6  6.0 - 8.3 g/dL   Albumin 4.0  3.5 - 5.2 g/dL   AST 12  0 - 37 U/L   ALT 8  0 - 53 U/L   Alkaline Phosphatase 56  39 - 117 U/L   Total Bilirubin 0.5  0.3 - 1.2 mg/dL   GFR calc non Af Amer >90  >90 mL/min   GFR calc Af Amer >90  >90 mL/min  CG4 I-STAT (LACTIC ACID)     Status: None   Collection Time    06/22/13  4:05 PM      Result Value Range   Lactic Acid, Venous 0.69  0.5 - 2.2 mmol/L    Ct Abdomen Pelvis W Contrast  06/22/2013   CLINICAL DATA:  Patient is having Mid back pain for 1 week with some abdominal pain. Pain is worse with movement. No history of trauma.  EXAM: CT ABDOMEN AND PELVIS WITH CONTRAST  TECHNIQUE: Multidetector CT imaging of the abdomen and pelvis was performed using the standard protocol following bolus administration of intravenous contrast.  CONTRAST:  OMNIPAQUE IOHEXOL 300 MG/ML  SOLN  COMPARISON:  Abdominal radiographs, 06/01/2012 and pelvic CT, 05/11/2010.  FINDINGS: There is subsegmental atelectasis at the lung bases. The heart is normal in size.  Liver, spleen, gallbladder and pancreas are unremarkable. No bile duct dilation. No adrenal masses.   Normal kidneys, ureters and bladder.  Mild increased stool in the right colon. The colon is otherwise unremarkable. Normal small bowel. Minimal hiatal hernia. Stomach is otherwise unremarkable. Normal appendix.  Vena cava filter is well positioned with its tip just below the right renal vein. There are mild atherosclerotic changes along the abdominal aorta and iliac arteries.  No abdominal wall hernia.  There are degenerative changes of the visualized spine most prominent at L4-L5.  IMPRESSION: 1. No acute findings. 2. Mild increased stool in the right colon. 3. Degenerative changes noted of the visualized spine most evident at L4-L5. 4. Well-positioned vena cava filter. Mild aortoiliac atherosclerotic calcifications. 5. Lung base subsegmental atelectasis. Evidence of a small hiatal hernia. 6. No other abnormalities.   Electronically Signed   By: Amie Portland M.D.   On: 06/22/2013 16:46    ED Course: Dr. Gwendolyn Grant in to examine the patient and CT scan ordered. Patient does not want pain medication here because he is driving.   Procedures EKG Interpretation   None       MDM  47 y.o. male with low back and abdominal pain. Labs and CT scan without any acute findings. Will treat with pain medication and muscle relaxants. I have reviewed this patient's vital signs, nurses notes, appropriate labs and imaging.  I have discussed findings with the patient and plan of care. He voices understanding.    Medication List    STOP taking these medications       acetaminophen 325 MG tablet  Commonly known as:  TYLENOL      TAKE these medications       HYDROcodone-ibuprofen 7.5-200 MG per tablet  Commonly known as:  VICOPROFEN  Take 1 tablet by mouth every 6 (six) hours as needed for moderate pain.     meloxicam 7.5 MG tablet  Commonly known as:  MOBIC  Take 1 tablet (7.5 mg total) by mouth daily.     methocarbamol 500 MG tablet  Commonly known as:  ROBAXIN  Take 1 tablet (500 mg total) by mouth 2  (two) times daily.      ASK your doctor about these medications       enoxaparin 40 MG/0.4ML injection  Commonly known as:  LOVENOX  Inject 0.4 mLs (40 mg total) into the skin daily.     FLEXERIL PO  Take by mouth.     GOODY HEADACHE PO  Take by mouth.     oxyCODONE 5 MG immediate release tablet  Commonly known as:  Oxy IR/ROXICODONE  Take 1 tablet (5 mg total) by mouth every 4 (four) hours as needed.           [facility] Orlene Och, [place] 06/22/13 1943

## 2013-10-31 ENCOUNTER — Ambulatory Visit: Payer: Self-pay | Admitting: Surgery

## 2014-07-02 ENCOUNTER — Emergency Department (HOSPITAL_BASED_OUTPATIENT_CLINIC_OR_DEPARTMENT_OTHER)
Admission: EM | Admit: 2014-07-02 | Discharge: 2014-07-02 | Disposition: A | Discharge status: Home / Self Care | Payer: BC Managed Care – PPO | Attending: Emergency Medicine | Admitting: Emergency Medicine

## 2014-07-02 ENCOUNTER — Emergency Department (HOSPITAL_BASED_OUTPATIENT_CLINIC_OR_DEPARTMENT_OTHER): Payer: BC Managed Care – PPO

## 2014-07-02 ENCOUNTER — Encounter (HOSPITAL_BASED_OUTPATIENT_CLINIC_OR_DEPARTMENT_OTHER): Payer: Self-pay | Admitting: *Deleted

## 2014-07-02 DIAGNOSIS — Z79899 Other long term (current) drug therapy: Secondary | ICD-10-CM | POA: Insufficient documentation

## 2014-07-02 DIAGNOSIS — Z8719 Personal history of other diseases of the digestive system: Secondary | ICD-10-CM | POA: Diagnosis not present

## 2014-07-02 DIAGNOSIS — Z791 Long term (current) use of non-steroidal anti-inflammatories (NSAID): Secondary | ICD-10-CM | POA: Insufficient documentation

## 2014-07-02 DIAGNOSIS — I861 Scrotal varices: Secondary | ICD-10-CM

## 2014-07-02 DIAGNOSIS — Z720 Tobacco use: Secondary | ICD-10-CM | POA: Insufficient documentation

## 2014-07-02 DIAGNOSIS — Z86711 Personal history of pulmonary embolism: Secondary | ICD-10-CM | POA: Insufficient documentation

## 2014-07-02 DIAGNOSIS — Z86718 Personal history of other venous thrombosis and embolism: Secondary | ICD-10-CM | POA: Diagnosis not present

## 2014-07-02 DIAGNOSIS — N433 Hydrocele, unspecified: Secondary | ICD-10-CM

## 2014-07-02 DIAGNOSIS — Z8781 Personal history of (healed) traumatic fracture: Secondary | ICD-10-CM | POA: Diagnosis not present

## 2014-07-02 DIAGNOSIS — R109 Unspecified abdominal pain: Secondary | ICD-10-CM | POA: Diagnosis present

## 2014-07-02 DIAGNOSIS — N5082 Scrotal pain: Secondary | ICD-10-CM

## 2014-07-02 HISTORY — DX: Unilateral inguinal hernia, without obstruction or gangrene, not specified as recurrent: K40.90

## 2014-07-02 LAB — URINALYSIS, ROUTINE W REFLEX MICROSCOPIC
BILIRUBIN URINE: NEGATIVE
GLUCOSE, UA: NEGATIVE mg/dL
HGB URINE DIPSTICK: NEGATIVE
Ketones, ur: NEGATIVE mg/dL
Leukocytes, UA: NEGATIVE
Nitrite: NEGATIVE
PH: 7 (ref 5.0–8.0)
Protein, ur: NEGATIVE mg/dL
SPECIFIC GRAVITY, URINE: 1.004 — AB (ref 1.005–1.030)
Urobilinogen, UA: 0.2 mg/dL (ref 0.0–1.0)

## 2014-07-02 MED ORDER — HYDROCODONE-ACETAMINOPHEN 5-325 MG PO TABS: 1.0000 | ORAL_TABLET | Freq: Four times a day (QID) | ORAL | Status: DC | PRN

## 2014-07-02 NOTE — ED Provider Notes (Signed)
CSN: [id number]     Arrival date & time 07/02/14  1241 History   First MD Initiated Contact with Patient 07/02/14 1359     Chief Complaint  Patient presents with  . Groin Pain     (Consider location/radiation/quality/duration/timing/severity/associated sxs/prior Treatment) HPI Comments: Patient presents today with a chief complaint of right groin pain.  He reports that the pain has been present since lifting his lawnmower out of a ditch one week ago.  Pain radiates down into his scrotum.  He reports that the pain was worse yesterday and now has improved somewhat.  Pain is worse with walking and standing.  He has been taking Ibuprofen without relief.  He denies abdominal pain, nausea, vomiting, constipation, urinary symptoms, fever, or chills.  Denies any scrotal swelling.  He had a Hernia repaired on the right 4-5 months ago by [place] Phys[place] in [place] and had a Hernia repair on the left in March 2014.    Patient is a 48 y.o. male presenting with groin pain. The history is provided by the patient.  Groin Pain    Past Medical History  Diagnosis Date  . Pulmonary embolism 2011    post fx leg  . DVT (deep venous thrombosis) 2011    post fx leg  . Medical history non-contributory   . Inguinal hernia    Past Surgical History  Procedure Laterality Date  . Leg surgery  2011    fx rt lower leg  . Vena cava filter placement  2011  . Fracture surgery  1984    fx lt ankle  . Wisdom tooth extraction    . Inguinal hernia repair Left 10/02/2012    Procedure: HERNIA REPAIR INGUINAL ADULT left ;  Surgeon: Lodema PilotBrian Layton, DO;  Location: [facility];  Service: General;  Laterality: Left;  with mesh  . Supra-umbilical hernia N/A 10/02/2012    Procedure: SUPRA-UMBILICAL HERNIA;  Surgeon: Lodema PilotBrian Layton, DO;  Location: [facility];  Service: General;  Laterality: N/A;  . Insertion of mesh Left 10/02/2012    Procedure: INSERTION OF MESH;  Surgeon: Lodema PilotBrian Layton, DO;  Location:  [facility];  Service: General;  Laterality: Left;  mesh insertion umbilical   No family history on file. History  Substance Use Topics  . Smoking status: Current Every Day Smoker -- 0.50 packs/day    Types: Cigarettes  . Smokeless tobacco: Not on file  . Alcohol Use: No    Review of Systems  All other systems reviewed and are negative.     Allergies  Review of patient's allergies indicates no known allergies.  Home Medications   Prior to Admission medications   Medication Sig Start Date End Date Taking? Authorizing Provider  Aspirin-Acetaminophen-Caffeine (GOODY HEADACHE PO) Take by mouth.    Historical Provider, MD  Cyclobenzaprine HCl (FLEXERIL PO) Take by mouth.    Historical Provider, MD  enoxaparin (LOVENOX) 40 MG/0.4ML injection Inject 0.4 mLs (40 mg total) into the skin daily. 10/02/12   Lodema PilotBrian Layton, DO  HYDROcodone-ibuprofen (VICOPROFEN) 7.5-200 MG per tablet Take 1 tablet by mouth every 6 (six) hours as needed for moderate pain. 06/22/13   Hope Orlene OchM Neese, NP  meloxicam (MOBIC) 7.5 MG tablet Take 1 tablet (7.5 mg total) by mouth daily. 06/22/13   Hope Orlene OchM Neese, NP  methocarbamol (ROBAXIN) 500 MG tablet Take 1 tablet (500 mg total) by mouth 2 (two) times daily. 06/22/13   Hope Orlene OchM Neese, NP  oxyCODONE (OXY IR/ROXICODONE) 5 MG immediate  release tablet Take 1 tablet (5 mg total) by mouth every 4 (four) hours as needed. 10/02/12   Lodema PilotBrian Layton, DO   BP 123/73 mmHg  Pulse 86  Temp(Src) 97.9 F (36.6 C) (Oral)  Resp 18  Ht 6\' 2"  (1.88 m)  Wt 180 lb (81.647 kg)  BMI 23.10 kg/m2  SpO2 100% Physical Exam  Constitutional: He appears well-developed and well-nourished. No distress.  HENT:  Head: Normocephalic and atraumatic.  Mouth/Throat: Oropharynx is clear and moist.  Neck: Normal range of motion. Neck supple.  Cardiovascular: Normal rate, regular rhythm and normal heart sounds.   Pulmonary/Chest: Effort normal and breath sounds normal.  Abdominal: Soft. Bowel  sounds are normal. He exhibits no distension and no mass. There is no tenderness. There is no rebound and no guarding. Hernia confirmed negative in the right inguinal area and confirmed negative in the left inguinal area.  Genitourinary: Right testis shows no mass, no swelling and no tenderness. Right testis is descended. Cremasteric reflex is not absent on the right side. Left testis shows no mass, no swelling and no tenderness. Left testis is descended. Cremasteric reflex is not absent on the left side. Circumcised. No penile erythema. No discharge found.  Chaperone present  Musculoskeletal: Normal range of motion.  Lymphadenopathy:       Right: No inguinal adenopathy present.       Left: No inguinal adenopathy present.  Neurological: He is alert.  Skin: Skin is warm and dry. He is not diaphoretic.  Psychiatric: He has a normal mood and affect.  Nursing note and vitals reviewed.   ED Course  Procedures (including critical care time) Labs Review Labs Reviewed  URINALYSIS, ROUTINE W REFLEX MICROSCOPIC - Abnormal; Notable for the following:    Specific Gravity, Urine 1.004 (*)    All other components within normal limits    Imaging Review No results found.   EKG Interpretation None      MDM   Final diagnoses:  None   Patient with a history of previous hernia repair presents today with right scrotum pain.  He reports onset of pain after lifting his lawn mower out of the ditch.  No abdominal pain.  No nausea, vomiting, or constipation.  On exam, no hernia palpated.  No swelling of the scrotum.  Scrotal ultrasound showing a small varicocele and a small hydrocele, but otherwise negative.  Feel that the patient is stable for discharge.  Instructed patient to follow up with the Surgeon who did the hernia repair.  Return precautions given.    Santiago GladHeather Magic Mohler, PA-C 07/04/14 2306  Tilden FossaElizabeth Rees, MD 07/06/14 [phone]

## 2014-07-02 NOTE — Discharge Instructions (Signed)
Hydrocele, Adult Fluid can collect around the testicles. This fluid forms in a sac. This condition is called a hydrocele. The collected fluid causes swelling of the scrotum. Usually, it affects just one testicle. Most of the time, the condition does not cause pain. Sometimes, the hydrocele goes away on its own. Other times, surgery is needed to get rid of the fluid. CAUSES A hydrocele does not develop often. Different things can cause a hydrocele in a man, including:  Injury to the scrotum.  Infection.  X-ray of the area around the scrotum.  A tumor or cancer of the testicle.  Twisting of a testicle.  Decreased blood flow to the scrotum. SYMPTOMS   Swelling without pain. The hydrocele feels like a water-filled balloon.  Swelling with pain. This can occur if the hydrocele was caused by infection or twisting.  Mild discomfort in the scrotum.  The hydrocele may feel heavy.  Swelling that gets smaller when you lie down. DIAGNOSIS  Your caregiver will do a physical exam to decide if you have a hydrocele. This may include:  Asking questions about your overall health, today and in the past. Your caregiver may ask about any injuries, X-rays, or infections.  Pushing on your abdomen or asking you to change positions to see if the size of the hydrocele changes.  Shining a light through the scrotum (transillumination) to see if the fluid inside the scrotum is clear.  Blood tests and urine tests to check for infection.  Imaging studies that take pictures of the scrotum and testicles. TREATMENT  Treatment depends in part on what caused the condition. Options include:  Watchful waiting. Your caregiver checks the hydrocele every so often.  Different surgeries to drain the fluid.  A needle may be put into the scrotum to drain fluid (needle aspiration). Fluid often returns after this type of treatment.  A cut (incision) may be made in the scrotum to remove the fluid sac  (hydrocelectomy).  An incision may be made in the groin to repair a hydrocele that has contact with abdominal fluids (communicating hydrocele).  Medicines to treat an infection (antibiotics). HOME CARE INSTRUCTIONS  What you need to do at home may depend on the cause of the hydrocele and type of treatment. In general:  Take all medicine as directed by your caregiver. Follow the directions carefully.  Ask your caregiver if there is anything you should not do while you recover (activities, lifting, work, sex).  If you had surgery to repair a communicating hydrocele, recovery time may vary. Ask you caregiver about your recovery time.  Avoid heavy lifting for 4 to 6 weeks.  If you had an incision on the scrotum or groin, wash it for 2 to 3 days after surgery. Do this as long as the skin is closed and there are no gaps in the wound. Wash gently, and avoid rubbing the incision.  Keep all follow-up appointments. SEEK MEDICAL CARE IF:   Your scrotum seems to be getting larger.  The area becomes more and more uncomfortable. SEEK IMMEDIATE MEDICAL CARE IF:  You have a fever. Document Released: 12/30/2009 Document Revised: 05/02/2013 Document Reviewed: 12/30/2009 Exit[employer] Patient Information 2015 [employer]. This information is not intended to replace advice given to you by your health care provider. Make sure you discuss any questions you have with your health care provider.  

## 2014-07-02 NOTE — ED Notes (Addendum)
Pt c/o right groin pain that began last week and resolved. The pain returned yesterday and today has become worse. Pt denies dysuria. Pt has hx of inguinal hernias and sts that this feels similar.

## 2014-07-25 ENCOUNTER — Emergency Department (HOSPITAL_BASED_OUTPATIENT_CLINIC_OR_DEPARTMENT_OTHER): Payer: BC Managed Care – PPO

## 2014-07-25 ENCOUNTER — Encounter (HOSPITAL_BASED_OUTPATIENT_CLINIC_OR_DEPARTMENT_OTHER): Payer: Self-pay | Admitting: *Deleted

## 2014-07-25 ENCOUNTER — Emergency Department (HOSPITAL_BASED_OUTPATIENT_CLINIC_OR_DEPARTMENT_OTHER)
Admission: EM | Admit: 2014-07-25 | Discharge: 2014-07-25 | Disposition: A | Discharge status: Home / Self Care | Payer: BC Managed Care – PPO | Attending: Emergency Medicine | Admitting: Emergency Medicine

## 2014-07-25 DIAGNOSIS — Z8781 Personal history of (healed) traumatic fracture: Secondary | ICD-10-CM | POA: Insufficient documentation

## 2014-07-25 DIAGNOSIS — Z86711 Personal history of pulmonary embolism: Secondary | ICD-10-CM | POA: Insufficient documentation

## 2014-07-25 DIAGNOSIS — Z791 Long term (current) use of non-steroidal anti-inflammatories (NSAID): Secondary | ICD-10-CM | POA: Diagnosis not present

## 2014-07-25 DIAGNOSIS — M545 Low back pain: Secondary | ICD-10-CM | POA: Diagnosis not present

## 2014-07-25 DIAGNOSIS — Z8719 Personal history of other diseases of the digestive system: Secondary | ICD-10-CM | POA: Insufficient documentation

## 2014-07-25 DIAGNOSIS — Z79899 Other long term (current) drug therapy: Secondary | ICD-10-CM | POA: Insufficient documentation

## 2014-07-25 DIAGNOSIS — Z86718 Personal history of other venous thrombosis and embolism: Secondary | ICD-10-CM | POA: Diagnosis not present

## 2014-07-25 DIAGNOSIS — Z720 Tobacco use: Secondary | ICD-10-CM | POA: Insufficient documentation

## 2014-07-25 DIAGNOSIS — M549 Dorsalgia, unspecified: Secondary | ICD-10-CM

## 2014-07-25 MED ORDER — KETOROLAC TROMETHAMINE 30 MG/ML IJ SOLN
60.0000 mg | Freq: Once | INTRAMUSCULAR | Status: AC
  Administered 2014-07-25: 60 mg via INTRAMUSCULAR
  Filled 2014-07-25: qty 2

## 2014-07-25 MED ORDER — HYDROMORPHONE HCL 1 MG/ML IJ SOLN
2.0000 mg | Freq: Once | INTRAMUSCULAR | Status: AC
  Administered 2014-07-25: 2 mg via INTRAMUSCULAR
  Filled 2014-07-25: qty 2

## 2014-07-25 MED ORDER — HYDROMORPHONE HCL 1 MG/ML IJ SOLN: 1.0000 mg | Freq: Once | INTRAMUSCULAR | Status: DC

## 2014-07-25 MED ORDER — NAPROXEN 500 MG PO TABS: 500.0000 mg | ORAL_TABLET | Freq: Two times a day (BID) | ORAL | Status: AC

## 2014-07-25 MED ORDER — CYCLOBENZAPRINE HCL 5 MG PO TABS: 5.0000 mg | ORAL_TABLET | Freq: Three times a day (TID) | ORAL | Status: AC | PRN

## 2014-07-25 MED ORDER — DIAZEPAM 5 MG PO TABS
5.0000 mg | ORAL_TABLET | Freq: Once | ORAL | Status: AC
  Administered 2014-07-25: 5 mg via ORAL
  Filled 2014-07-25: qty 1

## 2014-07-25 MED ORDER — KETOROLAC TROMETHAMINE 30 MG/ML IJ SOLN: 30.0000 mg | Freq: Once | INTRAMUSCULAR | Status: DC

## 2014-07-25 MED ORDER — OXYCODONE-ACETAMINOPHEN 5-325 MG PO TABS
1.0000 | ORAL_TABLET | Freq: Four times a day (QID) | ORAL | Status: AC | PRN
Start: 2014-07-25 — End: ?

## 2014-07-25 NOTE — Discharge Instructions (Signed)
Return to the ED with any concerns including weakness of legs, not able to urinate, loss of control of bowel or bladder, fever/chills, decreased level of alertness/lethargy, or any other alarming symptoms °

## 2014-07-25 NOTE — ED Notes (Signed)
Patient transported to X-ray 

## 2014-07-25 NOTE — ED Notes (Signed)
Pt c/o middle lower back pain x1 month that began getting worse this week. Pt has had back pain previously but not this bad.

## 2014-07-25 NOTE — ED Provider Notes (Signed)
CSN: [id number]     Arrival date & time 07/25/14  0945 History   First MD Initiated Contact with Patient 07/25/14 1022     Chief Complaint  Patient presents with  . Back Pain     (Consider location/radiation/quality/duration/timing/severity/associated sxs/prior Treatment) HPI  Pt presenting with c/o midlline lower back pain over the past month.  He states pain became worse this week.  He used a tilting machine yesterday to try to alleviate his pain but feels this may have made it worse.  No weakness of legs, no urinary retention, no incontinence of bowel or bladder, No fever/chills.  Pain is worse with certain positions- he was not able to get out of bed this morning due to severe pain.  No trauma.  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  Pain is constant and aching in nature.  No abdominal pain.  There are no other associated systemic symptoms, there are no other alleviating or modifying factors.   Past Medical History  Diagnosis Date  . Pulmonary embolism 2011    post fx leg  . DVT (deep venous thrombosis) 2011    post fx leg  . Medical history non-contributory   . Inguinal hernia    Past Surgical History  Procedure Laterality Date  . Leg surgery  2011    fx rt lower leg  . Vena cava filter placement  2011  . Fracture surgery  1984    fx lt ankle  . Wisdom tooth extraction    . Inguinal hernia repair Left 10/02/2012    Procedure: HERNIA REPAIR INGUINAL ADULT left ;  Surgeon: Lodema PilotBrian Layton, DO;  Location: [facility];  Service: General;  Laterality: Left;  with mesh  . Supra-umbilical hernia N/A 10/02/2012    Procedure: SUPRA-UMBILICAL HERNIA;  Surgeon: Lodema PilotBrian Layton, DO;  Location: [facility];  Service: General;  Laterality: N/A;  . Insertion of mesh Left 10/02/2012    Procedure: INSERTION OF MESH;  Surgeon: Lodema PilotBrian Layton, DO;  Location: [facility];  Service: General;  Laterality: Left;  mesh  insertion umbilical   No family history on file. History  Substance Use Topics  . Smoking status: Current Every Day Smoker -- 0.50 packs/day    Types: Cigarettes  . Smokeless tobacco: Not on file  . Alcohol Use: No    Review of Systems  ROS reviewed and all otherwise negative except for mentioned in HPI    Allergies  Review of patient's allergies indicates no known allergies.  Home Medications   Prior to Admission medications   Medication Sig Start Date End Date Taking? Authorizing Provider  traMADol (ULTRAM) 50 MG tablet Take by mouth every 6 (six) hours as needed.   Yes Historical Provider, MD  Aspirin-Acetaminophen-Caffeine (GOODY HEADACHE PO) Take by mouth.    Historical Provider, MD  cyclobenzaprine (FLEXERIL) 5 MG tablet Take 1 tablet (5 mg total) by mouth 3 (three) times daily as needed for muscle spasms. 07/25/14   Ethelda ChickMartha K Linker, MD  enoxaparin (LOVENOX) 40 MG/0.4ML injection Inject 0.4 mLs (40 mg total) into the skin daily. 10/02/12   Lodema PilotBrian Layton, DO  HYDROcodone-acetaminophen (NORCO/VICODIN) 5-325 MG per tablet Take 1-2 tablets by mouth every 6 (six) hours as needed. 07/02/14   Heather Laisure, PA-C  HYDROcodone-ibuprofen (VICOPROFEN) 7.5-200 MG per tablet Take 1 tablet by mouth every 6 (six) hours as needed for moderate pain. 06/22/13   Hope Orlene OchM Neese, NP  meloxicam (MOBIC) 7.5 MG tablet Take 1 tablet (7.5 mg total) by mouth daily. 06/22/13   Hope Orlene OchM Neese, NP  methocarbamol (ROBAXIN) 500 MG tablet Take 1 tablet (500 mg total) by mouth 2 (two) times daily. 06/22/13   Hope Orlene OchM Neese, NP  naproxen (NAPROSYN) 500 MG tablet Take 1 tablet (500 mg total) by mouth 2 (two) times daily. 07/25/14   Ethelda ChickMartha K Linker, MD  oxyCODONE (OXY IR/ROXICODONE) 5 MG immediate release tablet Take 1 tablet (5 mg total) by mouth every 4 (four) hours as needed. 10/02/12   Lodema PilotBrian Layton, DO  oxyCODONE-acetaminophen (PERCOCET/ROXICET) 5-325 MG per tablet Take 1-2 tablets by mouth every 6 (six) hours as  needed for severe pain. 07/25/14   Ethelda ChickMartha K Linker, MD   BP 109/69 mmHg  Pulse 75  Temp(Src) 98.3 F (36.8 C) (Oral)  Resp 18  Ht 6\' 2"  (1.88 m)  Wt 180 lb (81.647 kg)  BMI 23.10 kg/m2  SpO2 98%  Vitals reviewed Physical Exam  Physical Examination: General appearance - alert, uncomfortable appearing with minimal movement on stretcher, and in no distress Mental status - alert, oriented to person, place, and time Eyes - no conjunctival injection, no scleral icterus Mouth - mucous membranes moist, pharynx normal without lesions Chest - clear to auscultation, no wheezes, rales or rhonchi, symmetric air entry Heart - normal rate, regular rhythm, normal S1, S2, no murmurs, rubs, clicks or gallops Abdomen - soft, nontender, nondistended, no masses or organomegaly Back exam - no midline ttp over lumbar spine, no CVA tenderness, some right sided paraspinal tenderness Neurological - alert, oriented x 3, normal speech, strength 5/5 in extremities x 4, sensation distally intact Extremities - peripheral pulses normal, no pedal edema, no clubbing or cyanosis Skin - normal coloration and turgor, no rashes  ED Course  Procedures (including critical care time) Labs Review Labs Reviewed - No data to display  Imaging Review Dg Lumbar Spine Complete  07/25/2014   CLINICAL DATA:  One month history of low back pain without radiculopathy.  EXAM: LUMBAR SPINE - COMPLETE 4+ VIEW  COMPARISON:  CT scan and 10/09/2013  FINDINGS: There is a right convex lumbar scoliosis and mild associated degenerative lumbar spondylosis. No acute fracture is identified. The facets are normally aligned. No pars defects. The visualized bony pelvis is intact. An IVC filter is noted.  IMPRESSION: Scoliosis and mild degenerative lumbar spondylosis but no acute bony findings or pars defects.   Electronically Signed   By: Loralie ChampagneMark  Gallerani M.D.   On: 07/25/2014 11:29     EKG Interpretation None      MDM   Final diagnoses:   Back pain    Pt presnting with low back pain, this has been ongoing for the past several days, worse today. No specific trauma, no leg weakness, no fever/chills, no urinary retention, no incontinence of urine or stool.  No signs or symptoms of cauda equina, no fever to suggest epidural abscess.  Pt feels improved after meds in the ED.  Discharged with strict return precautions.  Pt agreeable with plan.    Ethelda ChickMartha K Linker, MD 07/26/14 [phone]

## 2020-09-06 ENCOUNTER — Emergency Department (HOSPITAL_COMMUNITY)
Admission: EM | Admit: 2020-09-06 | Discharge: 2020-09-06 | Disposition: A | Discharge status: Home / Self Care | Payer: Medicaid Other | Attending: Emergency Medicine | Admitting: Emergency Medicine

## 2020-09-06 ENCOUNTER — Emergency Department (HOSPITAL_COMMUNITY): Payer: Medicaid Other

## 2020-09-06 ENCOUNTER — Encounter (HOSPITAL_COMMUNITY): Payer: Self-pay | Admitting: Emergency Medicine

## 2020-09-06 ENCOUNTER — Other Ambulatory Visit: Payer: Self-pay

## 2020-09-06 DIAGNOSIS — Z86711 Personal history of pulmonary embolism: Secondary | ICD-10-CM | POA: Diagnosis not present

## 2020-09-06 DIAGNOSIS — E876 Hypokalemia: Secondary | ICD-10-CM | POA: Diagnosis not present

## 2020-09-06 DIAGNOSIS — B349 Viral infection, unspecified: Secondary | ICD-10-CM

## 2020-09-06 DIAGNOSIS — Z20822 Contact with and (suspected) exposure to covid-19: Secondary | ICD-10-CM | POA: Diagnosis not present

## 2020-09-06 DIAGNOSIS — Z7982 Long term (current) use of aspirin: Secondary | ICD-10-CM | POA: Insufficient documentation

## 2020-09-06 DIAGNOSIS — F17210 Nicotine dependence, cigarettes, uncomplicated: Secondary | ICD-10-CM | POA: Diagnosis not present

## 2020-09-06 DIAGNOSIS — R000 Tachycardia, unspecified: Secondary | ICD-10-CM | POA: Insufficient documentation

## 2020-09-06 DIAGNOSIS — R5383 Other fatigue: Secondary | ICD-10-CM | POA: Diagnosis present

## 2020-09-06 LAB — CBC WITH DIFFERENTIAL/PLATELET
Abs Immature Granulocytes: 0.06 10*3/uL (ref 0.00–0.07)
Basophils Absolute: 0.1 10*3/uL (ref 0.0–0.1)
Basophils Relative: 1 %
Eosinophils Absolute: 0.1 10*3/uL (ref 0.0–0.5)
Eosinophils Relative: 1 %
HCT: 38.8 % — ABNORMAL LOW (ref 39.0–52.0)
Hemoglobin: 13.1 g/dL (ref 13.0–17.0)
Immature Granulocytes: 1 %
Lymphocytes Relative: 4 %
Lymphs Abs: 0.4 10*3/uL — ABNORMAL LOW (ref 0.7–4.0)
MCH: 32.7 pg (ref 26.0–34.0)
MCHC: 33.8 g/dL (ref 30.0–36.0)
MCV: 96.8 fL (ref 80.0–100.0)
Monocytes Absolute: 1.2 10*3/uL — ABNORMAL HIGH (ref 0.1–1.0)
Monocytes Relative: 11 %
Neutro Abs: 9.4 10*3/uL — ABNORMAL HIGH (ref 1.7–7.7)
Neutrophils Relative %: 82 %
Platelets: 226 10*3/uL (ref 150–400)
RBC: 4.01 MIL/uL — ABNORMAL LOW (ref 4.22–5.81)
RDW: 13.9 % (ref 11.5–15.5)
WBC: 11.1 10*3/uL — ABNORMAL HIGH (ref 4.0–10.5)
nRBC: 0 % (ref 0.0–0.2)

## 2020-09-06 LAB — BASIC METABOLIC PANEL
Anion gap: 9 (ref 5–15)
BUN: 8 mg/dL (ref 6–20)
CO2: 25 mmol/L (ref 22–32)
Calcium: 8.2 mg/dL — ABNORMAL LOW (ref 8.9–10.3)
Chloride: 103 mmol/L (ref 98–111)
Creatinine, Ser: 0.83 mg/dL (ref 0.61–1.24)
GFR, Estimated: >60 mL/min (ref >60)
Glucose, Bld: 111 mg/dL — ABNORMAL HIGH (ref 70–99)
Potassium: 3.1 mmol/L — ABNORMAL LOW (ref 3.5–5.1)
Sodium: 137 mmol/L (ref 135–145)

## 2020-09-06 LAB — POC SARS CORONAVIRUS 2 AG -  ED: SARS Coronavirus 2 Ag: NEGATIVE

## 2020-09-06 MED ORDER — ACETAMINOPHEN 325 MG PO TABS
650.0000 mg | ORAL_TABLET | Freq: Once | ORAL | Status: AC
  Administered 2020-09-06: 650 mg via ORAL
  Filled 2020-09-06: qty 2

## 2020-09-06 MED ORDER — POTASSIUM CHLORIDE CRYS ER 20 MEQ PO TBCR
40.0000 meq | EXTENDED_RELEASE_TABLET | Freq: Once | ORAL | Status: AC
  Administered 2020-09-06: 40 meq via ORAL
  Filled 2020-09-06: qty 2

## 2020-09-06 MED ORDER — POTASSIUM CHLORIDE ER 10 MEQ PO TBCR: 10.0000 meq | EXTENDED_RELEASE_TABLET | Freq: Every day | ORAL | 0 refills | Status: AC

## 2020-09-06 NOTE — ED Notes (Signed)
States had 500 mg of tylenol at 0500 this morning prior to arrival

## 2020-09-06 NOTE — ED Triage Notes (Signed)
Pt states he has been running a fever and having bodyaches, weakness for 5 days. Pt states his wife has covid.

## 2020-09-06 NOTE — ED Notes (Signed)
Pt ambulated in room on room air. Oxygen sat 97% pt tolerated well

## 2020-09-06 NOTE — ED Provider Notes (Signed)
[name] EMERGENCY DEPARTMENT Provider Note   CSN: [id number] Arrival date & time: 09/06/20  0846     History Chief Complaint  Patient presents with  . Fever    Tyler Waters is a 55 y.o. male with PMH of DVT and pulmonary embolism who presents to the ED with complaints of cold symptoms.  Patient reports that for the past 5 days she has been experiencing extreme fatigue, dyspnea on exertion, frequent diarrhea, mild cough, constant headache, and generalized body aches.  He also endorses having regular fevers and chills at home for which he has been treating with Tylenol.  His history of pulmonary embolism and DVT with subsequent to fractured leg sustained over 10 years ago.  Given that there was a clear precipitating factor, he is no longer on anticoagulation.  He states that he has been drinking Pedialyte in an effort to remain hydrated, however admits that he is still likely dehydrated.  He has not been eating well for the past week due to diminished appetite.  Patient also notes that he recently broke out with a fever blister on his lip.  He denies any new or unusual extremity swelling, chest pain, hemoptysis, abdominal pain, room spinning dizziness, numbness or blurred vision, nausea or vomiting, or other symptoms.  Patient is unvaccinated for COVID-19.  Denies taking any regular medications.  HPI     Past Medical History:  Diagnosis Date  . DVT (deep venous thrombosis) (HCC) 2011   post fx leg  . Inguinal hernia   . Medical history non-contributory   . Pulmonary embolism (HCC) 2011   post fx leg    There are no problems to display for this patient.   Past Surgical History:  Procedure Laterality Date  . FRACTURE SURGERY  1984   fx lt ankle  . INGUINAL HERNIA REPAIR Left 10/02/2012   Procedure: HERNIA REPAIR INGUINAL ADULT left ;  Surgeon: Lodema Pilot, DO;  Location: [facility];  Service: General;  Laterality: Left;  with mesh  . INSERTION OF MESH  Left 10/02/2012   Procedure: INSERTION OF MESH;  Surgeon: Lodema Pilot, DO;  Location: [facility];  Service: General;  Laterality: Left;  mesh insertion umbilical  . LEG SURGERY  2011   fx rt lower leg  . SUPRA-UMBILICAL HERNIA N/A 10/02/2012   Procedure: SUPRA-UMBILICAL HERNIA;  Surgeon: Lodema Pilot, DO;  Location: [facility];  Service: General;  Laterality: N/A;  . VENA CAVA FILTER PLACEMENT  2011  . WISDOM TOOTH EXTRACTION         History reviewed. No pertinent family history.  Social History   Tobacco Use  . Smoking status: Current Every Day Smoker    Packs/day: 0.50    Types: Cigarettes  Substance Use Topics  . Alcohol use: No  . Drug use: No    Home Medications Prior to Admission medications   Medication Sig Start Date End Date Taking? Authorizing Provider  Aspirin-Acetaminophen-Caffeine (GOODY HEADACHE PO) Take by mouth.    [provider]  cyclobenzaprine (FLEXERIL) 5 MG tablet Take 1 tablet (5 mg total) by mouth 3 (three) times daily as needed for muscle spasms. 07/25/14   Mabe, Latanya Maudlin, MD  enoxaparin (LOVENOX) 40 MG/0.4ML injection Inject 0.4 mLs (40 mg total) into the skin daily. 10/02/12   Lodema Pilot, DO  HYDROcodone-acetaminophen (NORCO/VICODIN) 5-325 MG per tablet Take 1-2 tablets by mouth every 6 (six) hours as needed. 07/02/14   Santiago Glad, PA-C  HYDROcodone-ibuprofen (  VICOPROFEN) 7.5-200 MG per tablet Take 1 tablet by mouth every 6 (six) hours as needed for moderate pain. 06/22/13   Janne Napoleon, NP  meloxicam (MOBIC) 7.5 MG tablet Take 1 tablet (7.5 mg total) by mouth daily. 06/22/13   Janne Napoleon, NP  methocarbamol (ROBAXIN) 500 MG tablet Take 1 tablet (500 mg total) by mouth 2 (two) times daily. 06/22/13   Janne Napoleon, NP  naproxen (NAPROSYN) 500 MG tablet Take 1 tablet (500 mg total) by mouth 2 (two) times daily. 07/25/14   Mabe, Latanya Maudlin, MD  oxyCODONE (OXY IR/ROXICODONE) 5 MG immediate release tablet Take 1  tablet (5 mg total) by mouth every 4 (four) hours as needed. 10/02/12   Lodema Pilot, DO  oxyCODONE-acetaminophen (PERCOCET/ROXICET) 5-325 MG per tablet Take 1-2 tablets by mouth every 6 (six) hours as needed for severe pain. 07/25/14   MabeLatanya Maudlin, MD  traMADol (ULTRAM) 50 MG tablet Take by mouth every 6 (six) hours as needed.    [provider]    Allergies    Patient has no known allergies.  Review of Systems   Review of Systems  All other systems reviewed and are negative.   Physical Exam Updated Vital Signs BP (!) 110/53   Pulse (!) 111   Temp (!) 101.2 F (38.4 C) (Oral)   Resp (!) 29   Ht 6\' 2"  (1.88 m)   Wt 69.4 kg   SpO2 93%   BMI 19.64 kg/m   Physical Exam Vitals and nursing note reviewed. Exam conducted with a chaperone present.  Constitutional:      General: He is not in acute distress.    Appearance: He is ill-appearing. He is not toxic-appearing.  HENT:     Head: Normocephalic and atraumatic.     Nose: Congestion present.     Mouth/Throat:     Pharynx: Oropharynx is clear. No oropharyngeal exudate.     Comments: Cold sore lower lip, right side. Eyes:     General: No scleral icterus.    Conjunctiva/sclera: Conjunctivae normal.  Cardiovascular:     Rate and Rhythm: Regular rhythm. Tachycardia present.     Pulses: Normal pulses.  Pulmonary:     Effort: Pulmonary effort is normal. No respiratory distress.     Breath sounds: No wheezing.     Comments: No significant increased work of breathing.  Chest rises symmetric.  No tachypnea.  CTA bilaterally. Abdominal:     General: Abdomen is flat. There is no distension.     Palpations: Abdomen is soft.     Tenderness: There is no abdominal tenderness.  Musculoskeletal:     Right lower leg: No edema.     Left lower leg: No edema.  Skin:    General: Skin is dry.  Neurological:     General: No focal deficit present.     Mental Status: He is alert and oriented to person, place, and time.     GCS:  GCS eye subscore is 4. GCS verbal subscore is 5. GCS motor subscore is 6.  Psychiatric:        Mood and Affect: Mood normal.        Behavior: Behavior normal.        Thought Content: Thought content normal.     ED Results / Procedures / Treatments   Labs (all labs ordered are listed, but only abnormal results are displayed) Labs Reviewed  CBC WITH DIFFERENTIAL/PLATELET - Abnormal; Notable for the following components:  Result Value   WBC 11.1 (*)    RBC 4.01 (*)    HCT 38.8 (*)    Neutro Abs 9.4 (*)    Lymphs Abs 0.4 (*)    Monocytes Absolute 1.2 (*)    All other components within normal limits  BASIC METABOLIC PANEL - Abnormal; Notable for the following components:   Potassium 3.1 (*)    Glucose, Bld 111 (*)    Calcium 8.2 (*)    All other components within normal limits  POC SARS CORONAVIRUS 2 AG -  ED    EKG None  Radiology DG Chest Portable 1 View  Result Date: 09/06/2020 CLINICAL DATA:  Provided history: Cough. Patient reports fever and body aches, weakness for 5 days, reports wife has COVID. EXAM: PORTABLE CHEST 1 VIEW COMPARISON:  Prior chest radiograph 06/01/2012. FINDINGS: Heart size within normal limits. Subtle prominence of the interstitial lung markings, most notably within the left mid to lower lung field. No evidence of pleural effusion or pneumothorax. No acute bony abnormality identified. IMPRESSION: Subtle prominence of the interstitial lung markings, most notably within the left mid to lower lung field. Although nonspecific, these findings may reflect atypical/viral pneumonia given the provided history. Electronically Signed   By: Jackey Loge DO   On: 09/06/2020 10:25    Procedures Procedures   Medications Ordered in ED Medications  acetaminophen (TYLENOL) tablet 650 mg (650 mg Oral Given 09/06/20 0958)  potassium chloride SA (KLOR-CON) CR tablet 40 mEq (40 mEq Oral Given 09/06/20 1056)    ED Course  I have reviewed the triage vital signs and the  nursing notes.  Pertinent labs & imaging results that were available during my care of the patient were reviewed by me and considered in my medical decision making (see chart for details).    MDM Rules/Calculators/A&P                          THADDEAUS MONICA was evaluated in Emergency Department on 09/06/2020 for the symptoms described in the history of present illness. He was evaluated in the context of the global COVID-19 pandemic, which necessitated consideration that the patient might be at risk for infection with the SARS-CoV-2 virus that causes COVID-19. Institutional protocols and algorithms that pertain to the evaluation of patients at risk for COVID-19 are in a state of rapid change based on information released by regulatory bodies including the CDC and federal and state organizations. These policies and algorithms were followed during the patient's care in the ED.  I personally reviewed patient's medical chart and all notes from triage and staff during today's encounter. I have also ordered and reviewed all labs and imaging that I felt to be medically necessary in the evaluation of this patient's complaints and with consideration of their physical exam. If needed, translation services were available and utilized.   Patient with symptoms consistent with viral illness for 5 days.  Point-of-care antigen COVID-19 test was negative, but suspect that this was a false negative.  Patient's wife tested positive for COVID-19 yesterday at the pharmacy.  Discussed influenza testing, but given that it would not change management we have decided not to proceed with testing.   Patient febrile to 103.1 F here in the ED.  Treated with 60 mg Tylenol.  Patient with elevated heart rate to 115, likely combination of fever and mild dehydration.  BP stable and within normal limits.    Given profound weakness, diarrhea, and  exertional dyspnea, will obtain basic laboratory work-up and chest x-ray.  Their symptoms  are likely of viral etiology and we discussed that antibiotics are not indicated for viral infections.  Patient will be discharged with symptomatic treatment.  Patient is tolerating food and liquid without difficulty and I do not believe that laboratory work-up would yield any significant findings.  Low suspicion for electrolyte derangement, but emphasized the importance of eating regularly.  I also emphasized the importance of rest, continued oral hydration, and antipyretics as needed for fever control.    Chest x-ray with subtle prominence of the interstitial lung markings notably within left mid to lower lung field, possibly reflective of atypical/viral pneumonia.  This would be consistent with his close COVID-19 contacts in the setting of his cold symptoms.  CBC notable for mild leukocytosis to 11.1.  BMP with hypokalemia 3.1, likely in the setting of his loose stools.  Will replenish with 40 mEq K-Dur.  Will ambulate here in the ED to ensure that he maintains oxygen saturation with exertion.  Patient was ambulated in the ED without difficulty.  Maintained oxygenation of 97%.  Patient is not demonstrating any increased work of breathing or exertional hypoxia.  However, given that they are high risk for poor outcomes, will place ambulatory orders for outpatient infusion, if deemed to be qualified candidate per infusion center.  They understand that they will not proceed with treatment if their COVID-19 test is negative.   They were provided opportunity to ask any additional questions and have none at this time.  Prior to discharge patient is feeling well, agreeable with plan for discharge home.  They have expressed understanding of verbal discharge instructions as well as return precautions and are agreeable to the plan.    Final Clinical Impression(s) / ED Diagnoses Final diagnoses:  Viral illness    Rx / DC Orders ED Discharge Orders         Ordered    Ambulatory referral for Covid Treatment         09/06/20 1108           Elvera MariaGreen, Querida Beretta L, PA-C 09/06/20 1109    Mancel BaleWentz, Elliott, MD 09/06/20 [phone]

## 2020-09-06 NOTE — Discharge Instructions (Addendum)
Your history and physical exam is suggestive of a viral illness.  You have been tested for COVID-19.    Your potassium was low, presumably due to your loose stools.  Please take potassium supplement as directed.  You need to have your labs rechecked in 3 to 5 days to ensure correction of your electrolyte derangement.  I cannot emphasize enough the importance of continued Pedialyte and regular meals.  Please continue to check your temperatures regularly and take Tylenol and ibuprofen as needed for fever control.  NSAIDs such as ibuprofen will also help with your generalized body aches and headache symptoms.  Given that you are high risk, I have placed an ambulatory referral to the outpatient infusion center. They will call you to determine if you are a viable candidate for treatment.   Follow-up with your primary care provider regarding today's encounter and for ongoing management.  If you do not have one, please get established with one as soon as possible.    Return to the ED or seek immediate medical attention should you experience any new or worsening symptoms.    If you have additional questions, contact your local health department or call the epidemiologist on call at [phone] (available 24/7). ? This guidance is subject to change. For the most up-to-date guidance from [facility], please refer to their website: [email or web address]

## 2020-09-07 ENCOUNTER — Telehealth (HOSPITAL_COMMUNITY): Payer: Self-pay | Admitting: Family

## 2020-09-07 LAB — SARS CORONAVIRUS 2 (TAT 6-24 HRS): SARS Coronavirus 2: NEGATIVE

## 2020-09-07 NOTE — Telephone Encounter (Signed)
Patient is on clinic daily list as referral from ED for covid infusion. Reviewed  ED provider note with symptoms such as fever of 103.1 and chest xray possibly reflective of atypical/viral PNA. Covid test is negative. Spoke with patient, he states he has had a fever of around 103 for around 5 days. Discussed contacting his PCP. He advises he does not have a PCP as he just moved here. After discussion with lead app for the day, called patient back and provided the [facility] link for virtual visits. Discussed that he could also go to an urgent care of return to the ED for further needs.

## 2020-09-18 ENCOUNTER — Emergency Department (HOSPITAL_COMMUNITY): Payer: Medicaid Other

## 2020-09-18 ENCOUNTER — Encounter (HOSPITAL_COMMUNITY): Payer: Self-pay | Admitting: Emergency Medicine

## 2020-09-18 ENCOUNTER — Other Ambulatory Visit: Payer: Self-pay

## 2020-09-18 ENCOUNTER — Emergency Department (HOSPITAL_COMMUNITY)
Admission: EM | Admit: 2020-09-18 | Discharge: 2020-09-18 | Disposition: A | Discharge status: Home / Self Care | Payer: Medicaid Other | Attending: Emergency Medicine | Admitting: Emergency Medicine

## 2020-09-18 DIAGNOSIS — F17210 Nicotine dependence, cigarettes, uncomplicated: Secondary | ICD-10-CM | POA: Insufficient documentation

## 2020-09-18 DIAGNOSIS — N12 Tubulo-interstitial nephritis, not specified as acute or chronic: Secondary | ICD-10-CM | POA: Diagnosis not present

## 2020-09-18 DIAGNOSIS — R109 Unspecified abdominal pain: Secondary | ICD-10-CM | POA: Diagnosis not present

## 2020-09-18 DIAGNOSIS — M545 Low back pain, unspecified: Secondary | ICD-10-CM | POA: Diagnosis present

## 2020-09-18 DIAGNOSIS — Z7982 Long term (current) use of aspirin: Secondary | ICD-10-CM | POA: Insufficient documentation

## 2020-09-18 DIAGNOSIS — N390 Urinary tract infection, site not specified: Secondary | ICD-10-CM | POA: Diagnosis not present

## 2020-09-18 LAB — CBC WITH DIFFERENTIAL/PLATELET
Abs Immature Granulocytes: 0.08 10*3/uL — ABNORMAL HIGH (ref 0.00–0.07)
Basophils Absolute: 0.1 10*3/uL (ref 0.0–0.1)
Basophils Relative: 1 %
Eosinophils Absolute: 0.2 10*3/uL (ref 0.0–0.5)
Eosinophils Relative: 1 %
HCT: 39.3 % (ref 39.0–52.0)
Hemoglobin: 12.8 g/dL — ABNORMAL LOW (ref 13.0–17.0)
Immature Granulocytes: 1 %
Lymphocytes Relative: 11 %
Lymphs Abs: 1.7 10*3/uL (ref 0.7–4.0)
MCH: 32.2 pg (ref 26.0–34.0)
MCHC: 32.6 g/dL (ref 30.0–36.0)
MCV: 98.7 fL (ref 80.0–100.0)
Monocytes Absolute: 1.9 10*3/uL — ABNORMAL HIGH (ref 0.1–1.0)
Monocytes Relative: 12 %
Neutro Abs: 11.4 10*3/uL — ABNORMAL HIGH (ref 1.7–7.7)
Neutrophils Relative %: 74 %
Platelets: 413 10*3/uL — ABNORMAL HIGH (ref 150–400)
RBC: 3.98 MIL/uL — ABNORMAL LOW (ref 4.22–5.81)
RDW: 14 % (ref 11.5–15.5)
WBC: 15.3 10*3/uL — ABNORMAL HIGH (ref 4.0–10.5)
nRBC: 0 % (ref 0.0–0.2)

## 2020-09-18 LAB — BASIC METABOLIC PANEL
Anion gap: 8 (ref 5–15)
BUN: 14 mg/dL (ref 6–20)
CO2: 25 mmol/L (ref 22–32)
Calcium: 8.7 mg/dL — ABNORMAL LOW (ref 8.9–10.3)
Chloride: 104 mmol/L (ref 98–111)
Creatinine, Ser: 0.88 mg/dL (ref 0.61–1.24)
GFR, Estimated: >60 mL/min (ref >60)
Glucose, Bld: 101 mg/dL — ABNORMAL HIGH (ref 70–99)
Potassium: 3.8 mmol/L (ref 3.5–5.1)
Sodium: 137 mmol/L (ref 135–145)

## 2020-09-18 LAB — URINALYSIS, ROUTINE W REFLEX MICROSCOPIC
Bilirubin Urine: NEGATIVE
Glucose, UA: NEGATIVE mg/dL
Ketones, ur: NEGATIVE mg/dL
Nitrite: NEGATIVE
Protein, ur: NEGATIVE mg/dL
Specific Gravity, Urine: 1.006 (ref 1.005–1.030)
WBC, UA: >50 WBC/hpf — ABNORMAL HIGH (ref 0–5)
pH: 6 (ref 5.0–8.0)

## 2020-09-18 MED ORDER — CIPROFLOXACIN HCL 500 MG PO TABS: 500.0000 mg | ORAL_TABLET | Freq: Two times a day (BID) | ORAL | 0 refills | Status: AC

## 2020-09-18 MED ORDER — HYDROCODONE-ACETAMINOPHEN 5-325 MG PO TABS
1.0000 | ORAL_TABLET | Freq: Once | ORAL | Status: AC
  Administered 2020-09-18: 1 via ORAL
  Filled 2020-09-18: qty 1

## 2020-09-18 MED ORDER — KETOROLAC TROMETHAMINE 30 MG/ML IJ SOLN
30.0000 mg | Freq: Once | INTRAMUSCULAR | Status: AC
  Administered 2020-09-18: 30 mg via INTRAVENOUS
  Filled 2020-09-18: qty 1

## 2020-09-18 MED ORDER — IOHEXOL 300 MG/ML  SOLN
100.0000 mL | Freq: Once | INTRAMUSCULAR | Status: AC | PRN
  Administered 2020-09-18: 100 mL via INTRAVENOUS

## 2020-09-18 MED ORDER — HYDROCODONE-ACETAMINOPHEN 5-325 MG PO TABS: 1.0000 | ORAL_TABLET | Freq: Four times a day (QID) | ORAL | 0 refills | Status: AC | PRN

## 2020-09-18 MED ORDER — SODIUM CHLORIDE 0.9 % IV SOLN
1.0000 g | Freq: Once | INTRAVENOUS | Status: AC
  Administered 2020-09-18: 1 g via INTRAVENOUS
  Filled 2020-09-18: qty 10

## 2020-09-18 NOTE — ED Triage Notes (Signed)
Pt c/o painful burning urination, increased frequency, and dark colored urine for the past two weeks.  Pt states he has the urge to go as many as 30x a day.

## 2020-09-18 NOTE — Discharge Instructions (Addendum)
Please pick up antibiotics and take as prescribed to cover for a kidney infection today. We have sent your urine for culture and will call you in 2-3 days time IF the antibiotic needs to be changed.   Your CT scan did show some inflammation around the kidney that suggests infection however it is recommended that you have a repeat CT scan with and without contrast in 2-3 months for further evaluation. We are treating you for a kidney infection given your back pain and your urine showing a large amount of bacteria.   Please also take the pain medication as needed for severe pain however the pain is related to the infection and should improve once the antibiotics start working effectively.   Follow up with [facility] or your PCP in 1 week for recheck of your symptoms. If you do not have a PCP you can follow up with [facility].  Return to the ED IMMEDIATELY for any worsening symptoms including worsening pain, excessive vomiting, fevers > 100.4, inability to urinate, or no improvement after 48 hours on antibiotics

## 2020-09-18 NOTE — ED Provider Notes (Signed)
[facility] EMERGENCY DEPARTMENT Provider Note   CSN: [id number] Arrival date & time: 09/18/20  1607     History Chief Complaint  Patient presents with  . Dysuria    Tyler Waters is a 55 y.o. male who presents to the ED today with complaint of dysuria for the past 2 weeks. Pt reports he began having diffuse lower back pain 2 weeks ago that was atraumatic in nature. He then noticed urinary frequency and a burning sensation when he urinates. Pt states that he began having chills last night and has also had "discomfort" in his bilateral testicles. He has never had pain like this in the past. He is sexually active with his wife; no concern for STDs today. He has not been checking his temperature the past week however was recently seen for a viral pneumonia in the ED on 2/12 where he had fevers as high as 103; he feels like he is getting over this. No hx IVDA. No hx prolonged steroid use. Pt denies urinary or bowel incontinence, urinary retention, saddle anesthesia, or any other associated symptoms.   The history is provided by the patient and medical records.       Past Medical History:  Diagnosis Date  . DVT (deep venous thrombosis) (HCC) 2011   post fx leg  . Inguinal hernia   . Medical history non-contributory   . Pulmonary embolism (HCC) 2011   post fx leg    There are no problems to display for this patient.   Past Surgical History:  Procedure Laterality Date  . FRACTURE SURGERY  1984   fx lt ankle  . INGUINAL HERNIA REPAIR Left 10/02/2012   Procedure: HERNIA REPAIR INGUINAL ADULT left ;  Surgeon: Lodema Pilot, DO;  Location: [facility];  Service: General;  Laterality: Left;  with mesh  . INSERTION OF MESH Left 10/02/2012   Procedure: INSERTION OF MESH;  Surgeon: Lodema Pilot, DO;  Location: [facility];  Service: General;  Laterality: Left;  mesh insertion umbilical  . LEG SURGERY  2011   fx rt lower leg  . SUPRA-UMBILICAL HERNIA N/A  10/02/2012   Procedure: SUPRA-UMBILICAL HERNIA;  Surgeon: Lodema Pilot, DO;  Location: [facility];  Service: General;  Laterality: N/A;  . VENA CAVA FILTER PLACEMENT  2011  . WISDOM TOOTH EXTRACTION         History reviewed. No pertinent family history.  Social History   Tobacco Use  . Smoking status: Current Every Day Smoker    Packs/day: 0.50    Types: Cigarettes  . Smokeless tobacco: Never Used  Vaping Use  . Vaping Use: Never used  Substance Use Topics  . Alcohol use: No  . Drug use: No    Home Medications Prior to Admission medications   Medication Sig Start Date End Date Taking? Authorizing Provider  Aspirin-Acetaminophen-Caffeine (GOODY HEADACHE PO) Take 1 packet by mouth daily as needed (headache).   Yes [provider]  ciprofloxacin (CIPRO) 500 MG tablet Take 1 tablet (500 mg total) by mouth 2 (two) times daily. 09/18/20  Yes Venter, Margaux, PA-C  HYDROcodone-acetaminophen (NORCO/VICODIN) 5-325 MG tablet Take 1 tablet by mouth every 6 (six) hours as needed. 09/18/20  Yes Venter, Margaux, PA-C  potassium chloride (KLOR-CON) 10 MEQ tablet Take 1 tablet (10 mEq total) by mouth daily for 10 days. 09/06/20 09/16/20 Yes Lorelee New, PA-C  cyclobenzaprine (FLEXERIL) 5 MG tablet Take 1 tablet (5 mg total) by mouth 3 (three)  times daily as needed for muscle spasms. Patient not taking: No sig reported 07/25/14   Phillis HaggisMabe, Martha L, MD  enoxaparin (LOVENOX) 40 MG/0.4ML injection Inject 0.4 mLs (40 mg total) into the skin daily. Patient not taking: No sig reported 10/02/12   Lodema PilotLayton, Brian, DO  HYDROcodone-ibuprofen (VICOPROFEN) 7.5-200 MG per tablet Take 1 tablet by mouth every 6 (six) hours as needed for moderate pain. Patient not taking: No sig reported 06/22/13   Kerrie BuffaloNeese, Hope M, NP  meloxicam (MOBIC) 7.5 MG tablet Take 1 tablet (7.5 mg total) by mouth daily. Patient not taking: No sig reported 06/22/13   Janne NapoleonNeese, Hope M, NP  methocarbamol (ROBAXIN) 500 MG  tablet Take 1 tablet (500 mg total) by mouth 2 (two) times daily. Patient not taking: No sig reported 06/22/13   Kerrie BuffaloNeese, Hope M, NP  naproxen (NAPROSYN) 500 MG tablet Take 1 tablet (500 mg total) by mouth 2 (two) times daily. Patient not taking: No sig reported 07/25/14   Phillis HaggisMabe, Martha L, MD  oxyCODONE (OXY IR/ROXICODONE) 5 MG immediate release tablet Take 1 tablet (5 mg total) by mouth every 4 (four) hours as needed. 10/02/12   Lodema PilotLayton, Brian, DO  oxyCODONE-acetaminophen (PERCOCET/ROXICET) 5-325 MG per tablet Take 1-2 tablets by mouth every 6 (six) hours as needed for severe pain. Patient not taking: No sig reported 07/25/14   Phillis HaggisMabe, Martha L, MD  traMADol (ULTRAM) 50 MG tablet Take by mouth every 6 (six) hours as needed. Patient not taking: No sig reported    [provider]    Allergies    Patient has no known allergies.  Review of Systems   Review of Systems  Constitutional: Positive for chills.  Gastrointestinal: Negative for abdominal pain, nausea and vomiting.  Genitourinary: Positive for dysuria, frequency and testicular pain. Negative for hematuria, penile discharge, penile pain and scrotal swelling.  Musculoskeletal: Positive for back pain.  All other systems reviewed and are negative.   Physical Exam Updated Vital Signs BP 127/88 (BP Location: Right Arm)   Pulse (!) 101   Temp 98.9 F (37.2 C) (Oral)   Resp 18   Ht 6\' 2"  (1.88 m)   Wt 69.4 kg   SpO2 98%   BMI 19.64 kg/m   Physical Exam Vitals and nursing note reviewed.  Constitutional:      Appearance: He is not ill-appearing or diaphoretic.  HENT:     Head: Normocephalic and atraumatic.  Eyes:     Conjunctiva/sclera: Conjunctivae normal.  Cardiovascular:     Rate and Rhythm: Normal rate and regular rhythm.     Pulses: Normal pulses.  Pulmonary:     Effort: Pulmonary effort is normal.     Breath sounds: Normal breath sounds. No wheezing, rhonchi or rales.  Abdominal:     Palpations: Abdomen is soft.      Tenderness: There is no abdominal tenderness. There is right CVA tenderness and left CVA tenderness. There is no guarding or rebound.  Genitourinary:    Comments: Chaperone present for exam Penis without phimosis/paraphimosis, hypospadias, erythema, tenderness, or discharge. No rashes or lesions. Testes with no masses or tenderness, no swelling, and cremasterics reflex present bilaterally. No abnormal lie. No inguinal hernias or adenopathy present.  Rectal exam with good tone. No bogginess appreciated to prostate. No TTP to prostate.  Musculoskeletal:     Cervical back: Neck supple.     Comments: No C, T, or L midline spinal TTP. + diffuse bilateral parathoracic and paralumbar musculature TTP. ROM intact to neck  and back. Antalgic gait. Strength equal in BUE and BLEs. Sensation intact throughout. 2+ DP pulses bilaterally. Reflexes intact.  Skin:    General: Skin is warm and dry.  Neurological:     Mental Status: He is alert.     ED Results / Procedures / Treatments   Labs (all labs ordered are listed, but only abnormal results are displayed) Labs Reviewed  URINALYSIS, ROUTINE W REFLEX MICROSCOPIC - Abnormal; Notable for the following components:      Result Value   APPearance HAZY (*)    Hgb urine dipstick SMALL (*)    Leukocytes,Ua LARGE (*)    WBC, UA >50 (*)    Bacteria, UA RARE (*)    All other components within normal limits  BASIC METABOLIC PANEL - Abnormal; Notable for the following components:   Glucose, Bld 101 (*)    Calcium 8.7 (*)    All other components within normal limits  CBC WITH DIFFERENTIAL/PLATELET - Abnormal; Notable for the following components:   WBC 15.3 (*)    RBC 3.98 (*)    Hemoglobin 12.8 (*)    Platelets 413 (*)    Neutro Abs 11.4 (*)    Monocytes Absolute 1.9 (*)    Abs Immature Granulocytes 0.08 (*)    All other components within normal limits  URINE CULTURE    EKG None  Radiology CT Abdomen Pelvis W Contrast  Result Date:  09/18/2020 CLINICAL DATA:  Back pain and dysuria for 2 weeks. EXAM: CT ABDOMEN AND PELVIS WITH CONTRAST TECHNIQUE: Multidetector CT imaging of the abdomen and pelvis was performed using the standard protocol following bolus administration of intravenous contrast. CONTRAST:  OMNIPAQUE IOHEXOL 300 MG/ML  SOLN COMPARISON:  06/22/2013 FINDINGS: Lower Chest: No acute findings. Hepatobiliary: No hepatic masses identified. A few tiny hepatic cysts are noted. Gallbladder is unremarkable. No evidence of biliary ductal dilatation. Pancreas:  No mass or inflammatory changes. Spleen: Within normal limits in size and appearance. Adrenals/Urinary Tract: Several new poorly defined areas of decreased enhancement are seen in the mid and lower pole of the right kidney, with mild perinephric soft tissue stranding. This may be due to pyelonephritis or renal infarcts. Left kidney is normal in appearance. No evidence of renal calculi or hydronephrosis. Mild diffuse bladder wall thickening is seen, which may be due to cystitis or chronic bladder outlet obstruction. Stomach/Bowel: No evidence of obstruction, inflammatory process or abnormal fluid collections. Normal appendix visualized. Vascular/Lymphatic: No pathologically enlarged lymph nodes. No abdominal aortic aneurysm. Aortic atherosclerotic calcification noted. IVC filter again seen in appropriate position. Reproductive:  No mass or other significant abnormality. Other:  None. Musculoskeletal:  No suspicious bone lesions identified. IMPRESSION: Several new poorly defined areas of decreased enhancement in the right kidney, with mild perinephric soft tissue stranding. Differential diagnosis includes pyelonephritis and renal infarcts. Recommend correlation with urinalysis, and for follow-up by abdomen CT without and with contrast in 2-3 months. Mild diffuse bladder wall thickening, which may be due to cystitis or chronic bladder outlet obstruction. Aortic Atherosclerosis  (ICD10-I70.0). Electronically Signed   By: Danae Orleans M.D.   On: 09/18/2020 19:33    Procedures Procedures   Medications Ordered in ED Medications  HYDROcodone-acetaminophen (NORCO/VICODIN) 5-325 MG per tablet 1 tablet (has no administration in time range)  cefTRIAXone (ROCEPHIN) 1 g in sodium chloride 0.9 % 100 mL IVPB (0 g Intravenous Stopped 09/18/20 1846)  ketorolac (TORADOL) 30 MG/ML injection 30 mg (30 mg Intravenous Given 09/18/20 1846)  iohexol (OMNIPAQUE) 300  MG/ML solution 100 mL (100 mLs Intravenous Contrast Given 09/18/20 1903)    ED Course  I have reviewed the triage vital signs and the nursing notes.  Pertinent labs & imaging results that were available during my care of the patient were reviewed by me and considered in my medical decision making (see chart for details).    MDM Rules/Calculators/A&P                          55 year old male presents to the ED today complaining of diffuse lower back pain with urinary frequency/dysuria for the past 2 weeks as well as chills and bilateral testicular "discomfort".  On arrival to the ED patient is mildly tachycardic, I suspect this is secondary to pain.  He does appear uncomfortable on exam today.  He is afebrile, nontachypneic.  Is nontoxic-appearing on exam.  He has bilateral CVA tenderness palpation however he does have musculoskeletal tenderness palpation to the bilateral parathoracic and paralumbar regions.  No midline spinal tenderness palpation today.  He does have an antalgic gait and his back pain does seem more musculoskeletal than anything else.  He has no red flag symptoms today concerning for cauda equina, spinal epidural abscess, AAA however will plan to assess rectal tone at this time given back pain and urinary symptoms.  Question kidney stone as well as UTI versus prostatitis.  Will assess prostate during rectal exam.  Will need to do testicular exam as well, patient is currently in the hallway and will need to be  moved.  We will plan for urinalysis and lab work including CBC and BMP to assess for kidney function.   Rectal exam performed with good tone.  No tenderness palpation to the prostate and no bogginess appreciated.  He has no tenderness palpation to his testicles either. Low suspicion for cauda equina, prostatitis, epididimytis/orchitis. Will await lab work and urinalysis at this time. May consider CT renal stone study if urinalysis shows blood in urine.   U/A with small hgb on dipstick however none per HPF. Large leuks, > 50 WBCs, and rare bacteria. Will send for culture and provide rocephin to treat for UTI at this time.   CBC with leukocytosis 15,000 with left shift BMP without electrolyte abnormalities. Creatinine stable at 0.88  In the setting of infectious urine and back pain with leukocytosis question pyelo at this time. Will plan to CT scan pt for further evaluation of kidneys/also evaluate for potential early prostatitis.   CT scan: IMPRESSION:  Several new poorly defined areas of decreased enhancement in the  right kidney, with mild perinephric soft tissue stranding.  Differential diagnosis includes pyelonephritis and renal infarcts.  Recommend correlation with urinalysis, and for follow-up by abdomen  CT without and with contrast in 2-3 months.    Mild diffuse bladder wall thickening, which may be due to cystitis  or chronic bladder outlet obstruction.    Aortic Atherosclerosis (ICD10-I70.0).   Given urine with large leuks and > 50 WBCs will treat for pyelo at this time. Will discharge pt home with Cipro at this time and short course of pain medication. Pt given info for urology and [place] and wellness for follow up. He is instructed on strict return precautions. Pt in agreement with plan and stable for discharge home.   This note was prepared using Dragon voice recognition software and may include unintentional dictation errors due to the inherent limitations of voice  recognition software.  Final Clinical Impression(s) /  ED Diagnoses Final diagnoses:  Pyelonephritis  Lower urinary tract infectious disease    Rx / DC Orders ED Discharge Orders         Ordered    ciprofloxacin (CIPRO) 500 MG tablet  2 times daily        09/18/20 1949    HYDROcodone-acetaminophen (NORCO/VICODIN) 5-325 MG tablet  Every 6 hours PRN        09/18/20 1949           Discharge Instructions     Please pick up antibiotics and take as prescribed to cover for a kidney infection today. We have sent your urine for culture and will call you in 2-3 days time IF the antibiotic needs to be changed.   Your CT scan did show some inflammation around the kidney that suggests infection however it is recommended that you have a repeat CT scan with and without contrast in 2-3 months for further evaluation. We are treating you for a kidney infection given your back pain and your urine showing a large amount of bacteria.   Please also take the pain medication as needed for severe pain however the pain is related to the infection and should improve once the antibiotics start working effectively.   Follow up with [facility] or your PCP in 1 week for recheck of your symptoms. If you do not have a PCP you can follow up with [facility] and Wellness.  Return to the ED IMMEDIATELY for any worsening symptoms including worsening pain, excessive vomiting, fevers > 100.4, inability to urinate, or no improvement after 48 hours on antibiotics       Tanda Rockers, PA-C 09/18/20 1954    Eber Hong, MD 09/20/20 [phone]

## 2020-09-18 NOTE — ED Notes (Signed)
C/o burning with urination

## 2020-09-18 NOTE — ED Notes (Signed)
Pt returned from CT °

## 2020-09-20 LAB — URINE CULTURE: Culture: >=100000 — AB

## 2020-09-21 LAB — URINE CULTURE

## 2023-02-14 IMAGING — CT CT ABD-PELV W/ CM
2 of 5 series · 16 of 46 positions shown, 18 images · IV contrast (Omnipaque or Isovue)
Comparison: 06/22/2013

CLINICAL DATA: Back pain and dysuria for 2 weeks.

EXAM:
CT ABDOMEN AND PELVIS WITH CONTRAST
TECHNIQUE: Multidetector CT imaging of the abdomen and pelvis was performed
using the standard protocol following bolus administration of
intravenous contrast.
CONTRAST:  100mL OMNIPAQUE IOHEXOL 300 MG/ML  SOLN

[Series 2: axial st · axial · 0.77mm/px · z∈[-600,-175]mm · 13 of 95 slices shown, 15 images]
[im 5/95  soft-tissue]
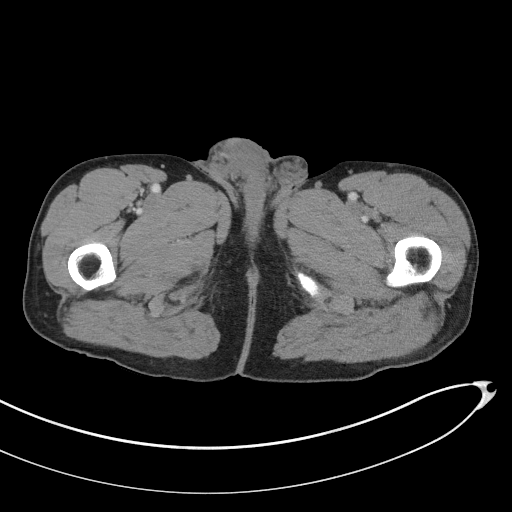
[im 5/95  bone]
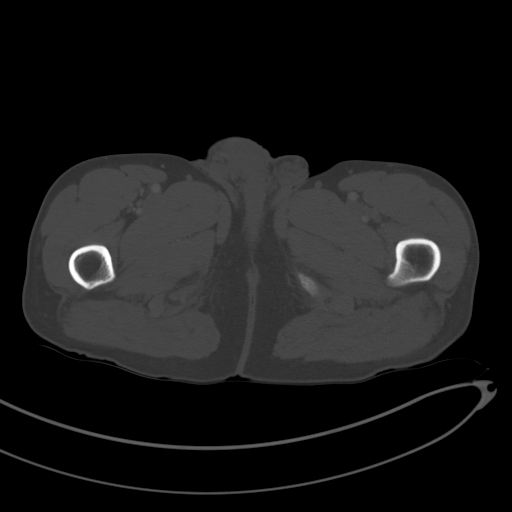
[im 15/95  soft-tissue]
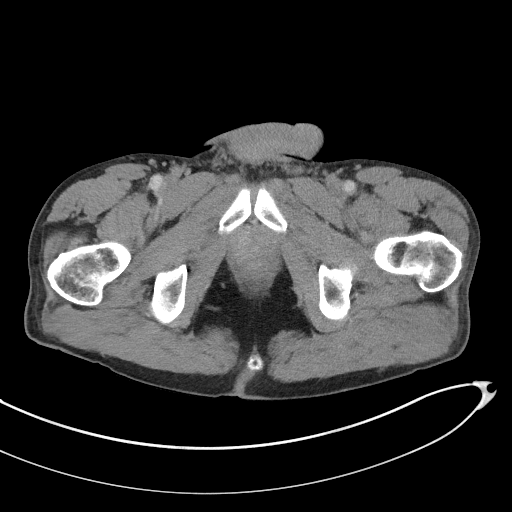
[im 20/95  soft-tissue]
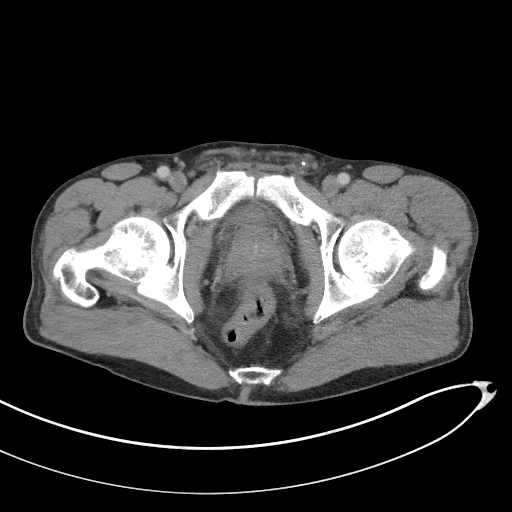
[im 25/95  soft-tissue]
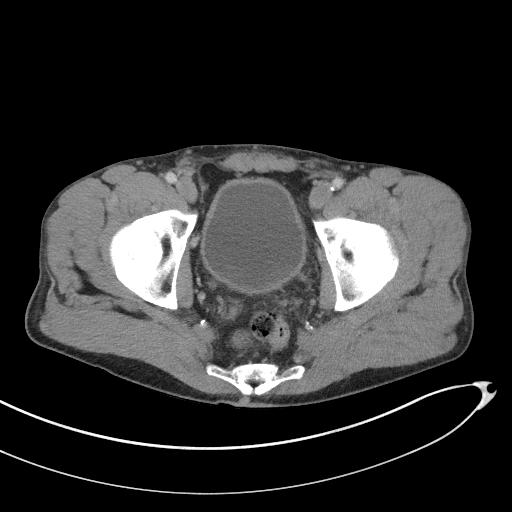
[im 35/95  soft-tissue]
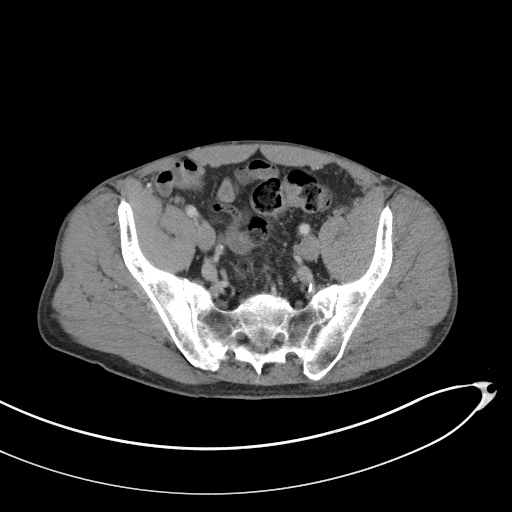
[im 40/95  soft-tissue]
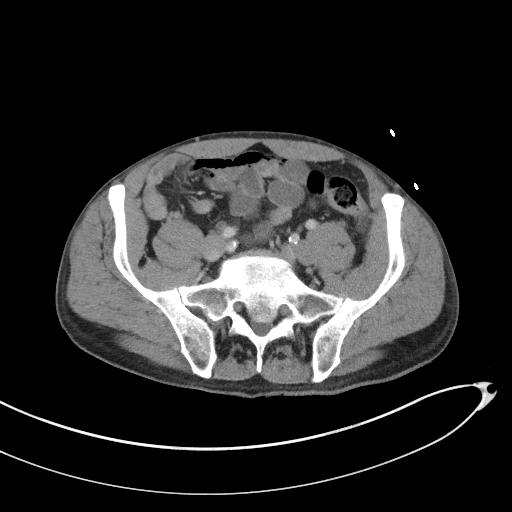
[im 50/95  soft-tissue]
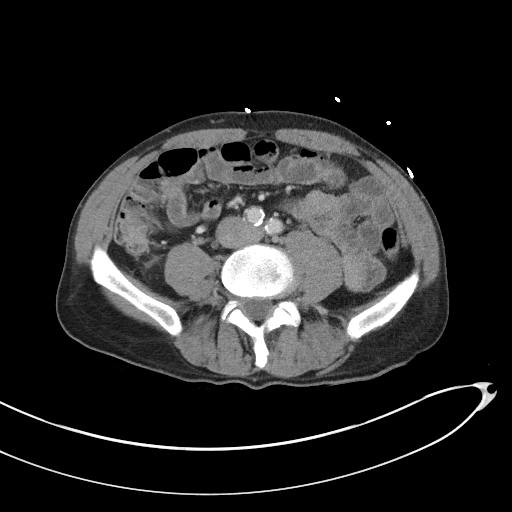
[im 55/95  soft-tissue]
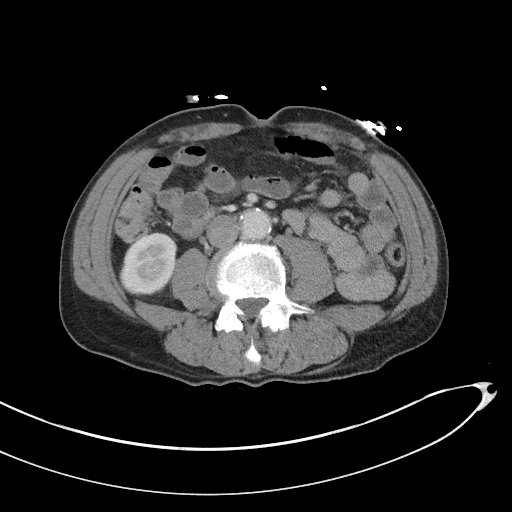
[im 60/95  soft-tissue]
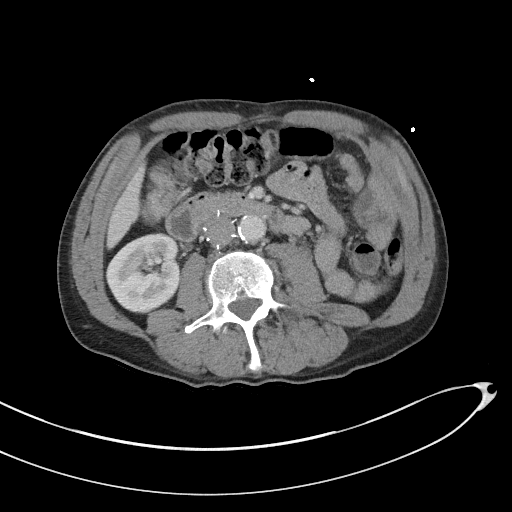
[im 60/95  bone]
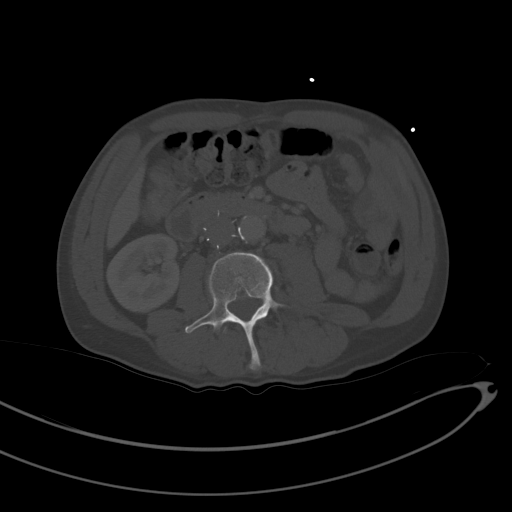
[im 70/95  soft-tissue]
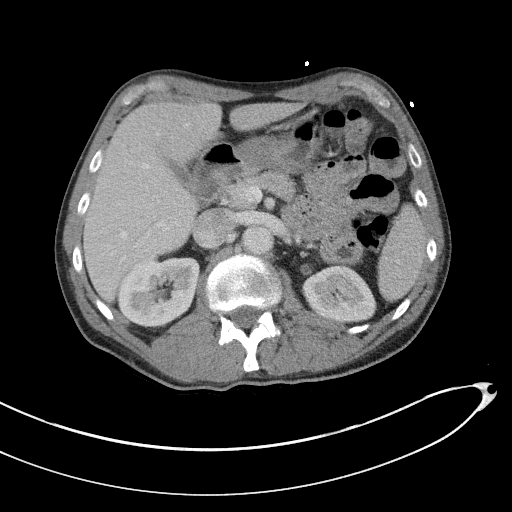
[im 75/95  soft-tissue]
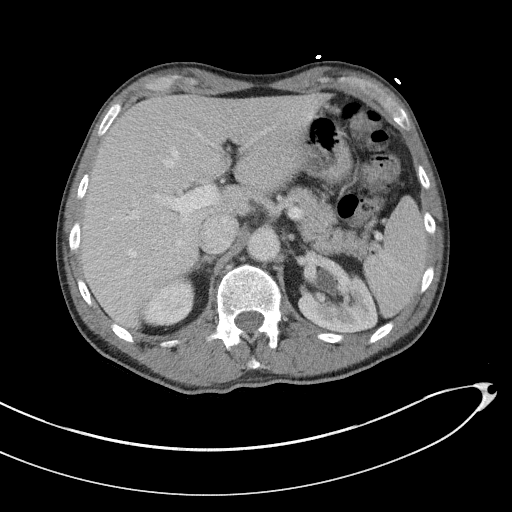
[im 80/95  soft-tissue]
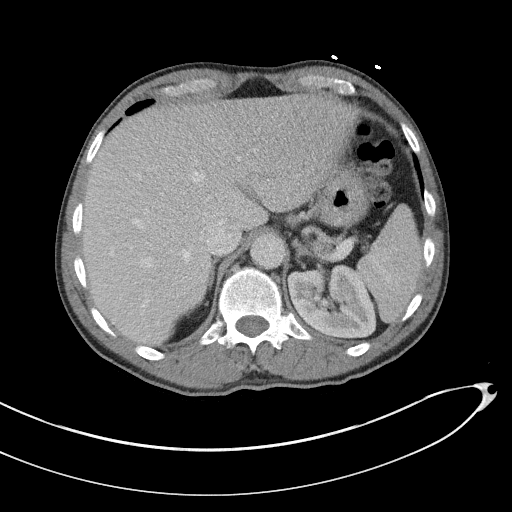
[im 90/95  soft-tissue]
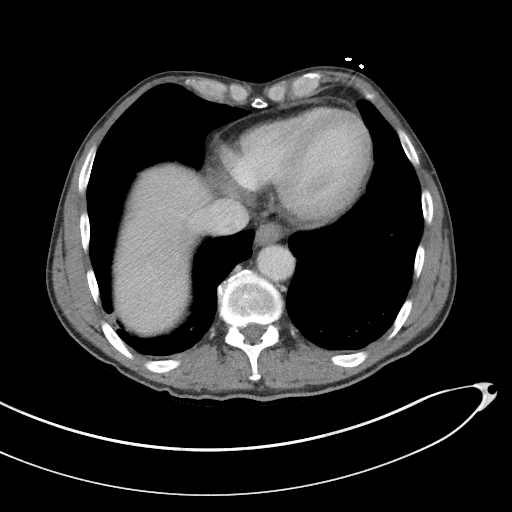

[Series 5: coronal st · coronal · 0.74mm/px · 3 of 95 slices shown]
[im 32/95  soft-tissue]
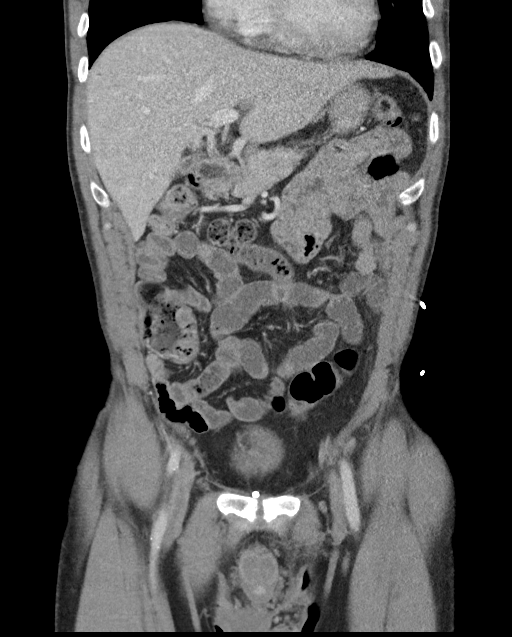
[im 42/95  soft-tissue]
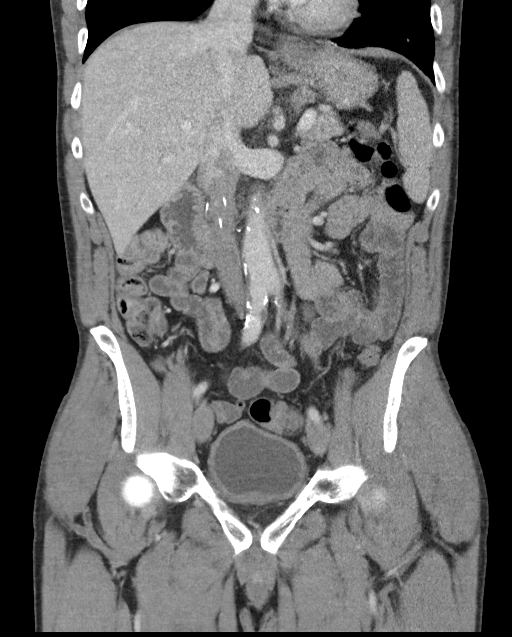
[im 53/95  soft-tissue]
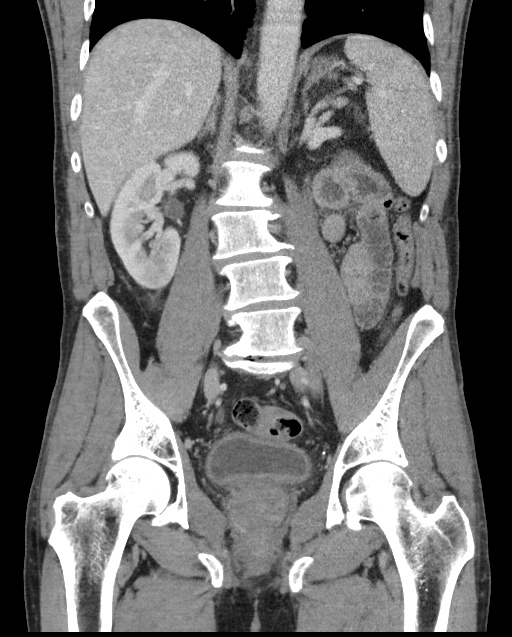

[16 of 46 positions shown; findings below may reference images not displayed]

FINDINGS: Lower Chest: No acute findings.

Hepatobiliary: No hepatic masses identified. A few tiny hepatic
cysts are noted. Gallbladder is unremarkable. No evidence of biliary
ductal dilatation.

Pancreas:  No mass or inflammatory changes.

Spleen: Within normal limits in size and appearance.

Adrenals/Urinary Tract: Several new poorly defined areas of
decreased enhancement are seen in the mid and lower pole of the
right kidney, with mild perinephric soft tissue stranding. This may
be due to pyelonephritis or renal infarcts. Left kidney is normal in
appearance. No evidence of renal calculi or hydronephrosis. Mild
diffuse bladder wall thickening is seen, which may be due to
cystitis or chronic bladder outlet obstruction.

Stomach/Bowel: No evidence of obstruction, inflammatory process or
abnormal fluid collections. Normal appendix visualized.

Vascular/Lymphatic: No pathologically enlarged lymph nodes. No
abdominal aortic aneurysm. Aortic atherosclerotic calcification
noted. IVC filter again seen in appropriate position.

Reproductive:  No mass or other significant abnormality.

Other:  None.

Musculoskeletal:  No suspicious bone lesions identified.
IMPRESSION: Several new poorly defined areas of decreased enhancement in the
right kidney, with mild perinephric soft tissue stranding.
Differential diagnosis includes pyelonephritis and renal infarcts.
Recommend correlation with urinalysis, and for follow-up by abdomen
CT without and with contrast in 2-3 months.

Mild diffuse bladder wall thickening, which may be due to cystitis
or chronic bladder outlet obstruction.

Aortic Atherosclerosis ([id number]).
